# Patient Record
Sex: Female | Born: 1980
Health system: Southern US, Community
[De-identification: ages and names within clinical notes are randomized; demographics above are authoritative.]

## PROBLEM LIST (undated history)

## (undated) ENCOUNTER — Inpatient Hospital Stay (HOSPITAL_COMMUNITY): Payer: Self-pay

## (undated) DIAGNOSIS — G43909 Migraine, unspecified, not intractable, without status migrainosus: Secondary | ICD-10-CM

## (undated) DIAGNOSIS — F32A Depression, unspecified: Secondary | ICD-10-CM

## (undated) DIAGNOSIS — T7840XA Allergy, unspecified, initial encounter: Secondary | ICD-10-CM

## (undated) DIAGNOSIS — R87629 Unspecified abnormal cytological findings in specimens from vagina: Secondary | ICD-10-CM

## (undated) DIAGNOSIS — F419 Anxiety disorder, unspecified: Secondary | ICD-10-CM

## (undated) DIAGNOSIS — F329 Major depressive disorder, single episode, unspecified: Secondary | ICD-10-CM

## (undated) HISTORY — DX: Anxiety disorder, unspecified: F41.9

## (undated) HISTORY — PX: APPENDECTOMY: SHX54

## (undated) HISTORY — DX: Depression, unspecified: F32.A

## (undated) HISTORY — DX: Major depressive disorder, single episode, unspecified: F32.9

## (undated) HISTORY — DX: Migraine, unspecified, not intractable, without status migrainosus: G43.909

## (undated) HISTORY — DX: Unspecified abnormal cytological findings in specimens from vagina: R87.629

## (undated) HISTORY — DX: Allergy, unspecified, initial encounter: T78.40XA

---

## 2006-06-06 ENCOUNTER — Encounter: Admission: RE | Admit: 2006-06-06 | Discharge: 2006-06-06 | Payer: Self-pay | Admitting: Family Medicine

## 2006-06-23 ENCOUNTER — Emergency Department (HOSPITAL_COMMUNITY): Admission: EM | Admit: 2006-06-23 | Discharge: 2006-06-23 | Payer: Self-pay | Admitting: *Deleted

## 2006-10-31 ENCOUNTER — Ambulatory Visit: Payer: Self-pay | Admitting: Obstetrics and Gynecology

## 2006-10-31 ENCOUNTER — Encounter: Payer: Self-pay | Admitting: Obstetrics and Gynecology

## 2006-11-04 ENCOUNTER — Ambulatory Visit (HOSPITAL_COMMUNITY): Admission: RE | Admit: 2006-11-04 | Discharge: 2006-11-04 | Payer: Self-pay | Admitting: Obstetrics and Gynecology

## 2006-11-14 ENCOUNTER — Ambulatory Visit: Payer: Self-pay | Admitting: Family Medicine

## 2006-12-13 ENCOUNTER — Encounter: Admission: RE | Admit: 2006-12-13 | Discharge: 2006-12-13 | Payer: Self-pay | Admitting: Gastroenterology

## 2007-03-30 ENCOUNTER — Emergency Department (HOSPITAL_COMMUNITY): Admission: EM | Admit: 2007-03-30 | Discharge: 2007-03-30 | Payer: Self-pay | Admitting: Emergency Medicine

## 2007-04-25 ENCOUNTER — Ambulatory Visit (HOSPITAL_BASED_OUTPATIENT_CLINIC_OR_DEPARTMENT_OTHER): Admission: RE | Admit: 2007-04-25 | Discharge: 2007-04-25 | Payer: Self-pay | Admitting: Urology

## 2007-04-25 ENCOUNTER — Encounter (INDEPENDENT_AMBULATORY_CARE_PROVIDER_SITE_OTHER): Payer: Self-pay | Admitting: Urology

## 2008-12-23 ENCOUNTER — Emergency Department (HOSPITAL_COMMUNITY): Admission: EM | Admit: 2008-12-23 | Discharge: 2008-12-23 | Payer: Self-pay | Admitting: Emergency Medicine

## 2009-06-23 ENCOUNTER — Inpatient Hospital Stay (HOSPITAL_COMMUNITY): Admission: AD | Admit: 2009-06-23 | Discharge: 2009-06-23 | Payer: Self-pay | Admitting: Obstetrics and Gynecology

## 2009-07-04 ENCOUNTER — Inpatient Hospital Stay (HOSPITAL_COMMUNITY): Admission: AD | Admit: 2009-07-04 | Discharge: 2009-07-04 | Payer: Self-pay | Admitting: Obstetrics and Gynecology

## 2009-07-05 DIAGNOSIS — R002 Palpitations: Secondary | ICD-10-CM

## 2009-07-05 DIAGNOSIS — R0602 Shortness of breath: Secondary | ICD-10-CM

## 2009-07-18 ENCOUNTER — Ambulatory Visit: Payer: Self-pay | Admitting: Cardiology

## 2009-07-18 DIAGNOSIS — R42 Dizziness and giddiness: Secondary | ICD-10-CM

## 2009-07-22 ENCOUNTER — Ambulatory Visit: Payer: Self-pay

## 2009-07-22 ENCOUNTER — Ambulatory Visit (HOSPITAL_COMMUNITY): Admission: RE | Admit: 2009-07-22 | Discharge: 2009-07-22 | Payer: Self-pay | Admitting: Cardiology

## 2009-07-22 ENCOUNTER — Ambulatory Visit: Payer: Self-pay | Admitting: Cardiology

## 2009-07-22 ENCOUNTER — Encounter: Payer: Self-pay | Admitting: Cardiology

## 2009-07-26 LAB — CONVERTED CEMR LAB
BUN: 5 mg/dL — ABNORMAL LOW (ref 6–23)
CO2: 22 meq/L (ref 19–32)
Calcium: 9.3 mg/dL (ref 8.4–10.5)
Chloride: 108 meq/L (ref 96–112)
Creatinine, Ser: 0.7 mg/dL (ref 0.4–1.2)
GFR calc non Af Amer: 127.22 mL/min (ref >60)
Glucose, Bld: 89 mg/dL (ref 70–99)
Hemoglobin: 12.1 g/dL (ref 12.0–15.0)
Magnesium: 1.7 mg/dL (ref 1.5–2.5)
Potassium: 4 meq/L (ref 3.5–5.1)
Sodium: 137 meq/L (ref 135–145)
TSH: 0.86 microintl units/mL (ref 0.35–5.50)

## 2009-08-05 ENCOUNTER — Inpatient Hospital Stay (HOSPITAL_COMMUNITY): Admission: AD | Admit: 2009-08-05 | Discharge: 2009-08-07 | Payer: Self-pay | Admitting: Obstetrics and Gynecology

## 2009-12-06 ENCOUNTER — Emergency Department (HOSPITAL_COMMUNITY): Admission: EM | Admit: 2009-12-06 | Discharge: 2009-12-06 | Payer: Self-pay | Admitting: Emergency Medicine

## 2010-03-12 ENCOUNTER — Emergency Department (HOSPITAL_COMMUNITY): Admission: EM | Admit: 2010-03-12 | Discharge: 2010-03-12 | Payer: Self-pay | Admitting: Emergency Medicine

## 2010-11-20 LAB — CBC
HCT: 35.8 % — ABNORMAL LOW (ref 36.0–46.0)
Hemoglobin: 11.9 g/dL — ABNORMAL LOW (ref 12.0–15.0)
MCHC: 33.4 g/dL (ref 30.0–36.0)
MCHC: 33.8 g/dL (ref 30.0–36.0)
MCV: 90.8 fL (ref 78.0–100.0)
Platelets: 262 10*3/uL (ref 150–400)
Platelets: 322 10*3/uL (ref 150–400)
RBC: 3.36 MIL/uL — ABNORMAL LOW (ref 3.87–5.11)
RDW: 13.2 % (ref 11.5–15.5)
WBC: 14.7 10*3/uL — ABNORMAL HIGH (ref 4.0–10.5)

## 2010-11-22 LAB — KLEIHAUER-BETKE STAIN
Fetal Cells %: 0 %
Quantitation Fetal Hemoglobin: 0 mL

## 2010-12-26 ENCOUNTER — Emergency Department (HOSPITAL_COMMUNITY)
Admission: EM | Admit: 2010-12-26 | Discharge: 2010-12-26 | Disposition: A | Discharge status: Home / Self Care | Payer: Self-pay | Attending: Emergency Medicine | Admitting: Emergency Medicine

## 2010-12-26 DIAGNOSIS — G4700 Insomnia, unspecified: Secondary | ICD-10-CM | POA: Insufficient documentation

## 2010-12-26 DIAGNOSIS — N644 Mastodynia: Secondary | ICD-10-CM | POA: Insufficient documentation

## 2010-12-26 DIAGNOSIS — F3289 Other specified depressive episodes: Secondary | ICD-10-CM | POA: Insufficient documentation

## 2010-12-26 DIAGNOSIS — F329 Major depressive disorder, single episode, unspecified: Secondary | ICD-10-CM | POA: Insufficient documentation

## 2011-01-02 NOTE — Op Note (Signed)
NAME:  Heather Lin, Heather Lin            ACCOUNT NO.:  [id number]   MEDICAL RECORD NO.:  [id number]          PATIENT TYPE:  AMB   LOCATION:  [facility]                         FACILITY:  [facility]   PHYSICIAN:  Sigmund I. Patsi Sears, M.D.DATE OF BIRTH:  [birth year]   DATE OF PROCEDURE:  DATE OF DISCHARGE:                               OPERATIVE REPORT   PREOPERATIVE DIAGNOSIS:  IC.   POSTOPERATIVE DIAGNOSIS:  IC.   OPERATION:  Cystourethroscopy, hydrodistention of bladder, biopsy of the  bladder with cauterization of biopsy sites, installation of Pyridium and  Marcaine in the bladder, injection of Marcaine and Kenalog in the  subcarinal space.  B and O suppository and IV Toradol given as well.   PREPARTION:  After appropriate preanesthesia, the patient was brought to  the operating room, placed upon the operating table in the dorsal supine  position where general LMA anesthesia was introduced.  She was then  replaced in the dorsal lithotomy position where the pubis was prepped  with Betadine solution and draped in the usual fashion.   HISTORY:  This 30 year old single female, has a history of right lower  quadrant pain, as well as right upper quadrant pain.  She has had  negative GI and GYN evaluations.  She also has urinary frequency,  dysuria, nocturia, difficulties starting her stream, as well as  dyspareunia.   PROCEDURE:  Cystourethroscopy was accomplished, and shows a bladder  volume of 650 mL.  This was hydrodistended to a size 700 mL.  Biopsies  were taken of the bladder and the biopsy sites were cauterized.  Following this, Marcaine Pyridium is inserted in the bladder, and  Marcaine Kenalog is injected into the base of the bladder.  The patient  tolerated the procedure well.  B and O suppository was given at the  beginning of the procedure.  IV Toradol was given at the end of the  procedure.      Sigmund I. Patsi Sears, M.D.  Electronically Signed     SIT/MEDQ  D:  04/25/2007   T:  04/25/2007  Job:  [id number]

## 2011-01-05 NOTE — Group Therapy Note (Signed)
NAME:  Heather Lin, Heather Lin NO.:  [id number]   MEDICAL RECORD NO.:  [id number]          PATIENT TYPE:  WOC   LOCATION:  [facility]                   FACILITY:  [facility]   PHYSICIAN:  Tinnie Gens, MD        DATE OF BIRTH:  1981/06/16   DATE OF SERVICE:  11/14/2006                                  CLINIC NOTE   CHIEF COMPLAINT:  Follow up.   HISTORY OF PRESENT ILLNESS:  The patient is a 30 year old, G3, P2 who is  here today for follow-up.  She has previously been seen by my partner  who has evaluated her.  She has a history of an abnormal Pap smear.  She  had a followup Pap smear approximately 10 days ago that was normal.  She  has right lower quadrant pain that is well above the pelvis and is  actually like right middle quadrant pain.  The pain is constant when it  comes.  She is unsure about what brings it on.  She has some nausea and  vomiting, although it is not really associated with the pain.  The pain  is not exactly where you would expect it to be for gallstone disease.  The pain does not radiate to her back.  It does not radiate to her  shoulder.  She does report chronic constipation and constant straining  including some blood in her stools.  She notes no mucusy stools or foul-  smelling stools.  However, she has had chronic constipation for quite  awhile.  The patient had a history of an ovarian cyst.  She was seen in  the ER and was found to have a right hemorrhagic cyst.  She had a pelvic  sonogram done on November 04, 2006, that showed normal pelvic ultrasound  with complete resolution of a right ovarian cyst.   IMPRESSION:  Right middle quadrant pain not likely to be gynecologic in  nature given that her pain persists after the resolution of her ovarian  cyst and that it is really not in the area of the pelvis.  Suspect this  is more likely to be gastrointestinal.  There is really no muscular wall  abnormality or tenderness.   PLAN:  Will refer to GI.  Follow  up here as needed.  She did have a Pap  smear.  She will receive a letter as to followup date.           ______________________________  Tinnie Gens, MD     TP/MEDQ  D:  11/14/2006  T:  11/14/2006  Job:  [id number]

## 2011-01-05 NOTE — Group Therapy Note (Signed)
NAME:  Heather Lin, Heather Lin NO.:  [id number]   MEDICAL RECORD NO.:  [id number]          PATIENT TYPE:  WOC   LOCATION:  [facility]                   FACILITY:  [facility]   PHYSICIAN:  Argentina Donovan, MD        DATE OF BIRTH:  Feb 21, 1981   DATE OF SERVICE:  10/31/2006                                  CLINIC NOTE   The patient is a 30 year old, gravida 3, para 2-0-1-2, who recently  moved to [place].  She was on birth control pills and came off in  October of last year because she was having some cysts in her breasts,  and they did an ultrasound and told her may being caused by the pill.  She is now back on the pill and has been for the last month.  However,  about time of probably the first ovulation when she moved here she had  severe right lower quadrant pain, [facility]  got [facility] which showed a 4 cm complex cyst on the right ovary,  and was sent here for followup.  It has now been 2 months and we decided  we would get a followup ultrasound on her.   PHYSICAL EXAMINATION:  ABDOMEN:  Soft, flat, nontender, no masses or  organomegaly.  EXTERNAL GENITALIA:  Normal.  BUS within normal limits.  VAGINA:  Clean and well rugated.  CERVIX:  Clean and parous.  UTERUS:  Anterior and of normal size, shape and consistency.  ADNEXA:  Could not be well outlined.  The pain seems to be well above the pelvis.  Pap smear was done.   IMPRESSION:  Ovarian cyst with intermittent right lower quadrant  discomfort.  She has had a history of cervical dysplasia, had a  colposcopy last year and was told just to follow up with a Pap.           ______________________________  Argentina Donovan, MD     PR/MEDQ  D:  10/31/2006  T:  11/02/2006  Job:  [id number]

## 2011-06-04 LAB — CBC
HCT: 36.7
Hemoglobin: 12.6
RBC: 4.21
RDW: 13.2
WBC: 6.4

## 2011-06-04 LAB — DIFFERENTIAL
Basophils Absolute: 0
Lymphocytes Relative: 53 — ABNORMAL HIGH
Lymphs Abs: 3.4 — ABNORMAL HIGH
Monocytes Absolute: 0.4
Monocytes Relative: 6
Neutro Abs: 2.4

## 2011-06-04 LAB — WET PREP, GENITAL

## 2011-06-04 LAB — PREGNANCY, URINE: Preg Test, Ur: NEGATIVE

## 2011-06-04 LAB — URINALYSIS, ROUTINE W REFLEX MICROSCOPIC
Glucose, UA: NEGATIVE
Nitrite: NEGATIVE
Specific Gravity, Urine: 1.011
pH: 6.5

## 2011-06-04 LAB — GC/CHLAMYDIA PROBE AMP, GENITAL: GC Probe Amp, Genital: NEGATIVE

## 2011-06-04 LAB — RPR: RPR Ser Ql: NONREACTIVE

## 2012-01-09 ENCOUNTER — Ambulatory Visit: Payer: PRIVATE HEALTH INSURANCE

## 2012-01-09 ENCOUNTER — Ambulatory Visit: Payer: Self-pay

## 2012-01-09 ENCOUNTER — Ambulatory Visit (INDEPENDENT_AMBULATORY_CARE_PROVIDER_SITE_OTHER): Payer: PRIVATE HEALTH INSURANCE | Admitting: Family Medicine

## 2012-01-09 VITALS — BP 100/64 | HR 77 | Temp 98.0°F | Resp 16 | Ht 63.5 in | Wt 132.0 lb

## 2012-01-09 DIAGNOSIS — M542 Cervicalgia: Secondary | ICD-10-CM

## 2012-01-09 DIAGNOSIS — M545 Low back pain: Secondary | ICD-10-CM

## 2012-01-09 DIAGNOSIS — S39012A Strain of muscle, fascia and tendon of lower back, initial encounter: Secondary | ICD-10-CM

## 2012-01-09 DIAGNOSIS — M546 Pain in thoracic spine: Secondary | ICD-10-CM

## 2012-01-09 MED ORDER — KETOROLAC TROMETHAMINE 60 MG/2ML IM SOLN
60.0000 mg | Freq: Once | INTRAMUSCULAR | Status: AC
  Administered 2012-01-09: 60 mg via INTRAMUSCULAR

## 2012-01-10 ENCOUNTER — Telehealth: Payer: Self-pay

## 2012-01-10 DIAGNOSIS — S39012A Strain of muscle, fascia and tendon of lower back, initial encounter: Secondary | ICD-10-CM | POA: Insufficient documentation

## 2012-01-10 DIAGNOSIS — M542 Cervicalgia: Secondary | ICD-10-CM | POA: Insufficient documentation

## 2012-01-10 MED ORDER — DICLOFENAC SODIUM 75 MG PO TBEC: DELAYED_RELEASE_TABLET | ORAL | Status: DC

## 2012-01-10 MED ORDER — METAXALONE 800 MG PO TABS: ORAL_TABLET | ORAL | Status: DC

## 2012-01-10 NOTE — Assessment & Plan Note (Signed)
Predominantly muscle related on physical exam. Discussed NSAIDs and tylenol. Physical Therapy referral made.  No signs of radiculopathy though with some arthritic changes in C5-C7. If L arm persists, would consider low dose neurontin.

## 2012-01-10 NOTE — Telephone Encounter (Signed)
2 prescriptions sent to pharmacy.

## 2012-01-10 NOTE — Telephone Encounter (Signed)
Saw Dr. Alvester Morin wants someone to prescribe pain medications.  [phone]  Wants to talk with a doctor only.
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
## 2012-01-10 NOTE — Patient Instructions (Addendum)
Back Pain, Adult Back pain is very common. The pain often gets better over time. The cause of back pain is usually not dangerous. Most people can learn to manage their back pain on their own.  HOME CARE   Stay active. Start with short walks on flat ground if you can. Try to walk farther each day.   Do not sit, drive, or stand in one place for more than 30 minutes. Do not stay in bed.   Do not avoid exercise or work. Activity can help your back heal faster.   Be careful when you bend or lift an object. Bend at your knees, keep the object close to you, and do not twist.   Sleep on a firm mattress. Lie on your side, and bend your knees. If you lie on your back, put a pillow under your knees.   Only take medicines as told by your doctor.   Put ice on the injured area.   Put ice in a plastic bag.   Place a towel between your skin and the bag.   Leave the ice on for 15 to 20 minutes, 3 to 4 times a day for the first 2 to 3 days. After that, you can switch between ice and heat packs.   Ask your doctor about back exercises or massage.   Avoid feeling anxious or stressed. Find good ways to deal with stress, such as exercise.  GET HELP RIGHT AWAY IF:   Your pain does not go away with rest or medicine.   Your pain does not go away in 1 week.   You have new problems.   You do not feel well.   The pain spreads into your legs.   You cannot control when you poop (bowel movement) or pee (urinate).   Your arms or legs feel weak or lose feeling (numbness).   You feel sick to your stomach (nauseous) or throw up (vomit).   You have belly (abdominal) pain.   You feel like you may pass out (faint).  MAKE SURE YOU:   Understand these instructions.   Will watch your condition.   Will get help right away if you are not doing well or get worse.  Document Released: 01/23/2008 Document Revised: 07/26/2011 Document Reviewed: 12/25/2010 [facility] Patient Information 2012 [place],  [place].  Cervical Sprain A cervical sprain is when the ligaments in the neck stretch or tear. The ligaments are the tissues that hold the neck bones in place. HOME CARE   Put ice on the injured area.   Put ice in a plastic bag.   Place a towel between your skin and the bag.   Leave the ice on for 15 to 20 minutes, 3 to 4 times a day.   Only take medicine as told by your doctor.   Keep all doctor visits as told.   Keep all physical therapy visits as told.   If your doctor gives you a neck collar, wear it as told.   Do not drive while wearing a neck collar.   Adjust your work station so that you have good posture while you work.   Avoid positions and activities that make your problems worse.   Warm up and stretch before being active.  GET HELP RIGHT AWAY IF:   You are bleeding or your stomach is upset.   You have an allergic reaction to your medicine.   Your problems (symptoms) get worse.   You develop new problems.   You  lose feeling (numbness) or you cannot move (paralysis) any part of your body.   You have tingling or weakness in any part of your body.   Your pain is not controlled with medicine.   You cannot take less pain medicine over time as planned.   Your activity level does not improve as expected.  MAKE SURE YOU:   Understand these instructions.   Will watch your condition.   Will get help right away if you are not doing well or get worse.  Document Released: 01/23/2008 Document Revised: 07/26/2011 Document Reviewed: 05/10/2011 [facility] Patient Information 2012 [place], [place].

## 2012-01-10 NOTE — Assessment & Plan Note (Signed)
NSAIDs, tylenol. Physical Therapy. Handout given. Follow up as needed.

## 2012-01-10 NOTE — Telephone Encounter (Signed)
She saw the doctor last night and cannot remember who it was but she needs to speak to someone about the pain she is having and discuss pain medications. She is supposed to get a referral for the physical therapist and in the interim she is in pain and having to work.  Ibuprofin and any other otc meds do not help.

## 2012-01-11 ENCOUNTER — Other Ambulatory Visit: Payer: Self-pay

## 2012-01-11 MED ORDER — TRAMADOL HCL 50 MG PO TABS: 50.0000 mg | ORAL_TABLET | Freq: Three times a day (TID) | ORAL | Status: AC | PRN

## 2012-01-11 MED ORDER — ALPRAZOLAM 1 MG PO TABS: 0.5000 mg | ORAL_TABLET | Freq: Two times a day (BID) | ORAL | Status: AC | PRN

## 2012-01-11 NOTE — Telephone Encounter (Signed)
I sent in some Ultram.

## 2012-01-11 NOTE — Telephone Encounter (Signed)
Let pt know that her xanax has been sent in. Pt has ?s about whether she can use [place] as a PCP provider. Suggested she gets established w/ one of our providers as a PCP by getting a CPE and that she might want to set up appt at 104. Pt states that the muscle relaxant is not helping her pain and wanted to discuss whether we could Rx something else for it, or if she can get referral to pain management if that is more appropriate. Please advise.

## 2012-01-11 NOTE — Telephone Encounter (Signed)
Xanax Rx printed.

## 2012-01-11 NOTE — Telephone Encounter (Signed)
Left message on machine that rx was sent  

## 2012-01-29 ENCOUNTER — Ambulatory Visit: Payer: Self-pay | Admitting: Family Medicine

## 2012-02-03 ENCOUNTER — Ambulatory Visit: Payer: PRIVATE HEALTH INSURANCE | Admitting: Emergency Medicine

## 2012-02-03 VITALS — BP 103/63 | HR 73 | Temp 97.7°F | Resp 16 | Ht 62.5 in | Wt 133.6 lb

## 2012-02-03 MED ORDER — ESCITALOPRAM OXALATE 10 MG PO TABS: 10.0000 mg | ORAL_TABLET | Freq: Every day | ORAL | Status: DC

## 2012-02-03 NOTE — Progress Notes (Signed)
  Subjective:    Patient ID: Heather Lin, female    DOB: [birth year], 31 y.o.   MRN: [id number]  HPI patient and her feeling overwhelmed. Her hospitalized for diabetes and pancreatitis has been out of the hospital recently. 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 The overwhelmed with what is going on. She has been on SSRIs in the past. She has not been on any SSRIs recently. She does have an ongoing prescription for Xanax 1 mg takes half to one twice a day but has been taking that recently since her husband has been out of work.    Review of Systems     Objective:   Physical Exam  Constitutional:       Patient is alert cooperative but seems sad.  HENT:  Head: Normocephalic.  Eyes: Pupils are equal, round, and reactive to light.  Neck: Neck supple. No thyromegaly present.  Cardiovascular: Normal rate, regular rhythm and normal heart sounds.  Exam reveals no gallop and no friction rub.   No murmur heard. Pulmonary/Chest: Effort normal and breath sounds normal.          Assessment & Plan:  Patient here with worsening anxiety depression. She denies suicidal ideations. She is concerned that she will probably not be able to register for school because she does not have the money to do that. We'll start Lexapro one a day to 10 mg and see how she does over the next 3 weeks

## 2012-02-21 ENCOUNTER — Other Ambulatory Visit: Payer: Self-pay | Admitting: Physician Assistant

## 2012-02-23 ENCOUNTER — Other Ambulatory Visit: Payer: Self-pay | Admitting: Physician Assistant

## 2012-03-15 ENCOUNTER — Other Ambulatory Visit: Payer: Self-pay | Admitting: Physician Assistant

## 2012-03-17 ENCOUNTER — Telehealth: Payer: Self-pay

## 2012-03-17 NOTE — Telephone Encounter (Signed)
I called pt to advise Rx was sent to [facility] for her on 7/27

## 2012-03-17 NOTE — Telephone Encounter (Signed)
The patient called to request Xanax Rx be refilled.  The patient states she has been out of Xanax since 03/13/12 and would like the Rx called into [facility] on [address].  Please call the patient at [phone].

## 2012-04-04 ENCOUNTER — Other Ambulatory Visit: Payer: Self-pay | Admitting: Physician Assistant

## 2012-04-04 ENCOUNTER — Other Ambulatory Visit: Payer: Self-pay | Admitting: Family Medicine

## 2012-04-04 MED ORDER — ALPRAZOLAM 1 MG PO TABS: 1.0000 mg | ORAL_TABLET | Freq: Two times a day (BID) | ORAL | Status: DC | PRN

## 2012-04-04 NOTE — Telephone Encounter (Signed)
[phone] Pt would like to know if something could be prescribed cheaper for her lexipro she has no insurance and she took her last one and is worried about not having any meds.

## 2012-04-04 NOTE — Progress Notes (Signed)
Please call in alprazolam rx. Rx has been sent to printer multiple times without it successfully printing.

## 2012-04-04 NOTE — Progress Notes (Signed)
  Subjective:    Patient ID: Heather Lin, female    DOB: [birth year], 31 y.o.   MRN: [id number]  HPI    Review of Systems     Objective:   Physical Exam        Assessment & Plan:  Rx finally printed, will fax into pharmacy.

## 2012-04-04 NOTE — Addendum Note (Signed)
Addended by: Jacqualyn Posey on: 04/04/2012 03:01 PM   Modules accepted: Orders

## 2012-04-23 ENCOUNTER — Other Ambulatory Visit: Payer: Self-pay | Admitting: Physician Assistant

## 2012-05-13 ENCOUNTER — Other Ambulatory Visit: Payer: Self-pay | Admitting: Physician Assistant

## 2012-05-13 ENCOUNTER — Ambulatory Visit: Payer: BC Managed Care – PPO

## 2012-06-12 ENCOUNTER — Ambulatory Visit: Payer: BC Managed Care – PPO | Admitting: Emergency Medicine

## 2012-06-12 VITALS — BP 126/76 | HR 84 | Temp 98.3°F | Resp 17 | Ht 63.0 in | Wt 125.0 lb

## 2012-06-12 DIAGNOSIS — G43909 Migraine, unspecified, not intractable, without status migrainosus: Secondary | ICD-10-CM

## 2012-06-12 MED ORDER — ELETRIPTAN HYDROBROMIDE 40 MG PO TABS: 40.0000 mg | ORAL_TABLET | ORAL | Status: DC | PRN

## 2012-06-12 NOTE — Progress Notes (Signed)
[facility] [address], [place] [address] [phone]  Date:  06/12/2012   Name:  Heather Lin   DOB:  11-05-1980   MRN:  [id number]  PCP:  No primary provider on file.    Chief Complaint: Migraine and Nausea   History of Present Illness:  Heather Lin is a 31 y.o. very pleasant female patient who presents with the following:  Usually has only four migraines per year.  Has experienced two migraines this week.  Is concerned that she may need to change her BC due to increasing migraines.  No antecedent illness or injury.  Denies other complaint.  Not having a headache currently pain was present last night and was gone in morning.  Patient Active Problem List  Diagnosis  . DIZZINESS  . PALPITATIONS  . SHORTNESS OF BREATH  . Neck pain  . Lumbar strain  . Migraine    No past medical history on file.  No past surgical history on file.  History  Substance Use Topics  . Smoking status: Current Every Day Smoker -- 1.0 packs/day for .8 years    Types: Pipe  . Smokeless tobacco: Not on file  . Alcohol Use: Not on file    No family history on file.  No Known Allergies  Medication list has been reviewed and updated.  Current Outpatient Prescriptions on File Prior to Visit  Medication Sig Dispense Refill  . ALPRAZolam (XANAX) 1 MG tablet TAKE 1 TABLET BY MOUTH TWICE DAILY AS NEEDED  30 tablet  0  . Etonogestrel (IMPLANON Corley) Inject into the skin.      . Multiple Vitamin (MULTIVITAMIN) capsule Take 1 capsule by mouth daily.      . traZODone (DESYREL) 100 MG tablet Take 100 mg by mouth at bedtime.      Marland Kitchen DISCONTD: escitalopram (LEXAPRO) 10 MG tablet Take 1 tablet (10 mg total) by mouth daily.  30 tablet  11  . diclofenac (VOLTAREN) 75 MG EC tablet Take bid X 1 week then prn  60 tablet  0  . metaxalone (SKELAXIN) 800 MG tablet 1 tid X 1 week then prn  90 tablet  0    Review of Systems:  As per HPI, otherwise negative.    Physical  Examination: Filed Vitals:   06/12/12 1423  BP: 126/76  Pulse: 84  Temp: 98.3 F (36.8 C)  Resp: 17   Filed Vitals:   06/12/12 1423  Height: 5\' 3"  (1.6 m)  Weight: 125 lb (56.7 kg)   Body mass index is 22.14 kg/(m^2). Ideal Body Weight: Weight in (lb) to have BMI = 25: 140.8    GEN: WDWN, NAD, Non-toxic, Alert & Oriented x 3 HEENT: Atraumatic, Normocephalic.  Ears and Nose: No external deformity. EXTR: No clubbing/cyanosis/edema NEURO: Normal gait.  PSYCH: Normally interactive. Conversant. Not depressed or anxious appearing.  Calm demeanor.    Assessment and Plan: Migraine history relpax Follow up as needed  Carmelina Dane, MD  I have reviewed and agree with documentation. Robert P. Merla Riches, M.D.

## 2012-11-25 ENCOUNTER — Observation Stay (HOSPITAL_COMMUNITY)
Admission: EM | Admit: 2012-11-25 | Discharge: 2012-11-26 | Disposition: A | Discharge status: Home / Self Care | Payer: BC Managed Care – PPO | Attending: General Surgery | Admitting: General Surgery

## 2012-11-25 ENCOUNTER — Emergency Department (HOSPITAL_COMMUNITY): Payer: BC Managed Care – PPO

## 2012-11-25 ENCOUNTER — Encounter (HOSPITAL_COMMUNITY): Payer: Self-pay | Admitting: Emergency Medicine

## 2012-11-25 DIAGNOSIS — N3289 Other specified disorders of bladder: Secondary | ICD-10-CM | POA: Insufficient documentation

## 2012-11-25 DIAGNOSIS — K3580 Unspecified acute appendicitis: Secondary | ICD-10-CM

## 2012-11-25 DIAGNOSIS — R1031 Right lower quadrant pain: Secondary | ICD-10-CM | POA: Insufficient documentation

## 2012-11-25 DIAGNOSIS — R110 Nausea: Secondary | ICD-10-CM | POA: Insufficient documentation

## 2012-11-25 DIAGNOSIS — K37 Unspecified appendicitis: Secondary | ICD-10-CM

## 2012-11-25 LAB — CBC WITH DIFFERENTIAL/PLATELET
Basophils Relative: 0 % (ref 0–1)
Hemoglobin: 13.3 g/dL (ref 12.0–15.0)
Lymphocytes Relative: 27 % (ref 12–46)
MCHC: 35.8 g/dL (ref 30.0–36.0)
Monocytes Relative: 6 % (ref 3–12)
Neutro Abs: 8.1 10*3/uL — ABNORMAL HIGH (ref 1.7–7.7)
Neutrophils Relative %: 67 % (ref 43–77)
RBC: 4.31 MIL/uL (ref 3.87–5.11)
WBC: 12.2 10*3/uL — ABNORMAL HIGH (ref 4.0–10.5)

## 2012-11-25 LAB — BASIC METABOLIC PANEL
BUN: 15 mg/dL (ref 6–23)
Chloride: 101 mEq/L (ref 96–112)
GFR calc Af Amer: >90 mL/min (ref >90)
Potassium: 3.7 mEq/L (ref 3.5–5.1)

## 2012-11-25 LAB — URINALYSIS, ROUTINE W REFLEX MICROSCOPIC
Bilirubin Urine: NEGATIVE
Leukocytes, UA: NEGATIVE
Nitrite: NEGATIVE
Specific Gravity, Urine: 1.025 (ref 1.005–1.030)
pH: 8 (ref 5.0–8.0)

## 2012-11-25 LAB — URINE MICROSCOPIC-ADD ON

## 2012-11-25 MED ORDER — SODIUM CHLORIDE 0.9 % IV SOLN
1000.0000 mL | INTRAVENOUS | Status: DC
  Administered 2012-11-25 (×3): 1000 mL via INTRAVENOUS

## 2012-11-25 MED ORDER — DIPHENHYDRAMINE HCL 50 MG/ML IJ SOLN
25.0000 mg | Freq: Once | INTRAMUSCULAR | Status: AC
  Administered 2012-11-25: 25 mg via INTRAVENOUS

## 2012-11-25 MED ORDER — IOHEXOL 300 MG/ML  SOLN: 50.0000 mL | Freq: Once | INTRAMUSCULAR | Status: AC | PRN

## 2012-11-25 MED ORDER — SODIUM CHLORIDE 0.9 % IV SOLN
1000.0000 mL | Freq: Once | INTRAVENOUS | Status: AC
  Administered 2012-11-25: 1000 mL via INTRAVENOUS

## 2012-11-25 MED ORDER — HYDROMORPHONE HCL PF 1 MG/ML IJ SOLN
1.0000 mg | Freq: Once | INTRAMUSCULAR | Status: AC
  Administered 2012-11-25: 1 mg via INTRAVENOUS
  Filled 2012-11-25: qty 1

## 2012-11-25 MED ORDER — ONDANSETRON HCL 4 MG/2ML IJ SOLN
4.0000 mg | Freq: Once | INTRAMUSCULAR | Status: AC
  Administered 2012-11-25: 4 mg via INTRAVENOUS
  Filled 2012-11-25: qty 2

## 2012-11-25 MED ORDER — DIPHENHYDRAMINE HCL 50 MG/ML IJ SOLN
INTRAMUSCULAR | Status: AC
  Filled 2012-11-25: qty 1

## 2012-11-25 MED ORDER — IOHEXOL 300 MG/ML  SOLN
25.0000 mL | INTRAMUSCULAR | Status: AC
  Administered 2012-11-25 (×2): 25 mL via ORAL

## 2012-11-25 MED ORDER — SODIUM CHLORIDE 0.9 % IV SOLN
1.0000 g | Freq: Once | INTRAVENOUS | Status: AC
  Administered 2012-11-25: 1 g via INTRAVENOUS
  Filled 2012-11-25: qty 1

## 2012-11-25 MED ORDER — IOHEXOL 300 MG/ML  SOLN
100.0000 mL | Freq: Once | INTRAMUSCULAR | Status: AC | PRN
  Administered 2012-11-25: 100 mL via INTRAVENOUS

## 2012-11-25 NOTE — ED Notes (Signed)
Onset one day ago generalized abdominal pain with nausea. Last BM today normal. States pain worsening overtime. Currently 10/10 achy cramping sharp.

## 2012-11-25 NOTE — ED Notes (Signed)
Patient transported to CT 

## 2012-11-25 NOTE — H&P (Signed)
Heather Lin is an 32 y.o. female.   Chief Complaint: RLQ abdominal pain. HPI:  Pt is a 32 yo F who presents with 24 h of abdominal pain.  It started out as severe and generalized.  It has now localized to RLQ as the worst spot.  Originally, the patient thought she was developing an ovarian cyst, because she has gotten these frequently.  These are usually on the left, however, and her pain was worst on the right.  She has had nausea, but no vomiting.  She has felt bad all day.  She denies fevers/chills.  She denies constipation or diarrhea.    Past Medical History  Diagnosis Date  . Allergy   . Arthritis   . Depression   . Anxiety     History reviewed. No pertinent past surgical history.  Family History  Problem Relation Age of Onset  . Cancer Maternal Grandmother   . Anuerysm Maternal Grandfather   . Alzheimer's disease Paternal Grandmother    Social History:  reports that she has been smoking Pipe.  She does not have any smokeless tobacco history on file. She reports that she does not drink alcohol or use illicit drugs.  Allergies: No Known Allergies  Meds:  ibuprofen  Results for orders placed during the hospital encounter of 11/25/12 (from the past 48 hour(s))  CBC WITH DIFFERENTIAL     Status: Abnormal   Collection Time    11/25/12  6:49 PM      Result Value Range   WBC 12.2 (*) 4.0 - 10.5 K/uL   RBC 4.31  3.87 - 5.11 MIL/uL   Hemoglobin 13.3  12.0 - 15.0 g/dL   HCT 09.8  11.9 - 14.7 %   MCV 86.1  78.0 - 100.0 fL   MCH 30.9  26.0 - 34.0 pg   MCHC 35.8  30.0 - 36.0 g/dL   RDW 82.9  56.2 - 13.0 %   Platelets 314  150 - 400 K/uL   Neutrophils Relative 67  43 - 77 %   Neutro Abs 8.1 (*) 1.7 - 7.7 K/uL   Lymphocytes Relative 27  12 - 46 %   Lymphs Abs 3.3  0.7 - 4.0 K/uL   Monocytes Relative 6  3 - 12 %   Monocytes Absolute 0.7  0.1 - 1.0 K/uL   Eosinophils Relative 1  0 - 5 %   Eosinophils Absolute 0.1  0.0 - 0.7 K/uL   Basophils Relative 0  0 - 1 %   Basophils  Absolute 0.0  0.0 - 0.1 K/uL  BASIC METABOLIC PANEL     Status: None   Collection Time    11/25/12  6:49 PM      Result Value Range   Sodium 136  135 - 145 mEq/L   Potassium 3.7  3.5 - 5.1 mEq/L   Chloride 101  96 - 112 mEq/L   CO2 27  19 - 32 mEq/L   Glucose, Bld 94  70 - 99 mg/dL   BUN 15  6 - 23 mg/dL   Creatinine, Ser 8.65  0.50 - 1.10 mg/dL   Calcium 9.6  8.4 - 78.4 mg/dL   GFR calc non Af Amer >90  >90 mL/min   GFR calc Af Amer >90  >90 mL/min   Comment:            The eGFR has been calculated     using the CKD EPI equation.     This calculation  has not been     validated in all clinical     situations.     eGFR's persistently     <90 mL/min signify     possible Chronic Kidney Disease.  URINALYSIS, ROUTINE W REFLEX MICROSCOPIC     Status: Abnormal   Collection Time    11/25/12  7:04 PM      Result Value Range   Color, Urine YELLOW  YELLOW   APPearance TURBID (*) CLEAR   Specific Gravity, Urine 1.025  1.005 - 1.030   pH 8.0  5.0 - 8.0   Glucose, UA NEGATIVE  NEGATIVE mg/dL   Hgb urine dipstick MODERATE (*) NEGATIVE   Bilirubin Urine NEGATIVE  NEGATIVE   Ketones, ur NEGATIVE  NEGATIVE mg/dL   Protein, ur NEGATIVE  NEGATIVE mg/dL   Urobilinogen, UA 0.2  0.0 - 1.0 mg/dL   Nitrite NEGATIVE  NEGATIVE   Leukocytes, UA NEGATIVE  NEGATIVE  URINE MICROSCOPIC-ADD ON     Status: None   Collection Time    11/25/12  7:04 PM      Result Value Range   Squamous Epithelial / LPF RARE  RARE   WBC, UA 0-2  <3 WBC/hpf   RBC / HPF 0-2  <3 RBC/hpf   Bacteria, UA RARE  RARE   Urine-Other AMORPHOUS URATES/PHOSPHATES    POCT PREGNANCY, URINE     Status: None   Collection Time    11/25/12  7:13 PM      Result Value Range   Preg Test, Ur NEGATIVE  NEGATIVE   Comment:            THE SENSITIVITY OF THIS     METHODOLOGY IS >24 mIU/mL   Ct Abdomen Pelvis W Contrast  11/25/2012  *RADIOLOGY REPORT*  Clinical Data: Abdominal pain and nausea.  CT ABDOMEN AND PELVIS WITH CONTRAST   Technique:  Multidetector CT imaging of the abdomen and pelvis was performed following the standard protocol during bolus administration of intravenous contrast.  Contrast: OMNIPAQUE IOHEXOL 300 MG/ML  SOLN  Comparison: Prior pelvic ultrasound performed 03/30/2007  Findings: The visualized lung bases are clear.  The liver and spleen are unremarkable in appearance.  The gallbladder is within normal limits.  The pancreas and adrenal glands are unremarkable.  The kidneys are unremarkable in appearance.  There is no evidence of hydronephrosis.  No renal or ureteral stones are seen.  No perinephric stranding is appreciated.  No free fluid is identified.  The small bowel is unremarkable in appearance.  The stomach is within normal limits.  No acute vascular abnormalities are seen.  The appendix is difficult to fully characterize, though it appears to be diffusely dilated to 1.4 cm in maximal diameter, best seen on coronal images, with mild surrounding soft tissue edema.  This raises concern for acute appendicitis.  There is question of mildly prominent adjacent pericecal nodes; evaluation is suboptimal given the relative lack of surrounding fat.  The colon is unremarkable in appearance.  The bladder is moderately distended and grossly unremarkable.  The uterus is within normal limits.  The ovaries are difficult to fully assess.  There is a 5.8 x 4.7 cm cystic lesion at the right adnexa, along the pelvic cul-de-sac.  This may simply reflect an ovarian cyst, though pelvic ultrasound would be helpful for further evaluation, on an elective non-emergent basis.  No inguinal lymphadenopathy is seen.  No acute osseous abnormalities are identified.  IMPRESSION:  1.  Suspect acute appendicitis, with apparent  dilatation of the appendix to 1.4 cm in maximal diameter, and mild surrounding soft tissue edema.  This is difficult to fully characterize given the relative lack of surrounding fat.  Question of mildly prominent adjacent  pericecal nodes.  No evidence for perforation or abscess formation.  2.  5.8 x 4.7 cm cystic lesion at the right adnexa, along the pelvic cul-de-sac.  This may simply reflect an ovarian cyst, though pelvic ultrasound would be helpful for further evaluation, on an elective non-emergent basis.  These results were called by telephone on 11/25/2012 at 10:04 p.m. to Dr. Azalia Bilis, who verbally acknowledged these results.   Original Report Authenticated By: Tonia Ghent, M.D.     Review of Systems  Constitutional: Positive for malaise/fatigue.  HENT: Negative.   Eyes: Negative.   Respiratory: Negative.   Cardiovascular: Negative.   Gastrointestinal: Positive for nausea and abdominal pain.  Genitourinary: Negative.   Musculoskeletal: Negative.   Endo/Heme/Allergies:       Having period now.  Psychiatric/Behavioral: The patient is nervous/anxious.     Blood pressure 112/65, pulse 80, temperature 98.3 F (36.8 C), temperature source Oral, resp. rate 16, last menstrual period 11/25/2012, SpO2 100.00%. Physical Exam  Constitutional: She is oriented to person, place, and time. She appears well-developed and well-nourished. She appears distressed (looks uncomfortable).  HENT:  Head: Normocephalic and atraumatic.  Piercing in right nare and lower center lip  Eyes: Conjunctivae are normal. Pupils are equal, round, and reactive to light. No scleral icterus.  Neck: Normal range of motion. Neck supple. No tracheal deviation present. No thyromegaly present.  Cardiovascular: Regular rhythm, normal heart sounds and intact distal pulses.  Exam reveals no gallop and no friction rub.   No murmur heard. Tachycardic   Respiratory: Effort normal and breath sounds normal. No respiratory distress. She has no wheezes. She has no rales. She exhibits no tenderness.  GI: Soft. Bowel sounds are normal. She exhibits no mass. There is tenderness (RLQ, + rovsing's sign). There is rebound and guarding (voluntary RLQ  guarding.).  Musculoskeletal: Normal range of motion. She exhibits no edema and no tenderness.  Lymphadenopathy:    She has no cervical adenopathy.  Neurological: She is alert and oriented to person, place, and time. Coordination normal.  Skin: Skin is warm and dry. No rash noted. She is not diaphoretic. No erythema. No pallor.  Psychiatric: She has a normal mood and affect. Her behavior is normal. Judgment and thought content normal.     Assessment/Plan Acute appendicitis: Plan laparoscopic appendectomy  Appendectomy was described to the patient.  The incisions and surgical technique were explained.  The patient was advised of the risks of surgery including, but not limited to, bleeding, infection, damage to other structures, risk of an open operation, risk of abscess, and risk of blood clot.  The recovery was also described to the patient.  She was advised that he will have lifting restrictions for 2 weeks.   NPO until surgery, IVF, pain control, antiemetics IV antibiotics.  Alby Schwabe 11/25/2012, 11:22 PM

## 2012-11-25 NOTE — Preoperative (Signed)
Beta Blockers   Reason not to administer Beta Blockers:Not Applicable 

## 2012-11-25 NOTE — ED Provider Notes (Signed)
History     CSN: [id number]  Arrival date & time 11/25/12  1839   First MD Initiated Contact with Patient 11/25/12 1853      Chief Complaint  Patient presents with  . Abdominal Pain  . Nausea    The history is provided by the patient.   the patient reports developing generalized abdominal pain over the past 24-48 hours.  Her pain is worsened over the past 12 hours.  She's had nausea without vomiting.  She denies diarrhea.  No fevers or chills.  Her pain is localized to right lower quadrant.  Her pain is currently 10 out of 10.  Her pain is worsened by movement and palpation.  She's never had pain like this before.  No urinary symptoms.  No vaginal complaints.  Past Medical History  Diagnosis Date  . Allergy   . Arthritis   . Depression   . Anxiety     History reviewed. No pertinent past surgical history.  Family History  Problem Relation Age of Onset  . Cancer Maternal Grandmother   . Anuerysm Maternal Grandfather   . Alzheimer's disease Paternal Grandmother     History  Substance Use Topics  . Smoking status: Current Every Day Smoker -- 1.00 packs/day for .8 years    Types: Pipe  . Smokeless tobacco: Not on file  . Alcohol Use: No    OB History   Grav Para Term Preterm Abortions TAB SAB Ect Mult Living                  Review of Systems  Gastrointestinal: Positive for abdominal pain.  All other systems reviewed and are negative.    Allergies  Review of patient's allergies indicates no known allergies.  Home Medications   Current Outpatient Rx  Name  Route  Sig  Dispense  Refill  . ibuprofen (ADVIL,MOTRIN) 200 MG tablet   Oral   Take 400 mg by mouth every 6 (six) hours as needed for pain.           BP 112/65  Pulse 80  Temp(Src) 98.3 F (36.8 C) (Oral)  Resp 16  SpO2 100%  Physical Exam  Nursing note and vitals reviewed. Constitutional: She is oriented to person, place, and time. She appears well-developed and well-nourished. No distress.   HENT:  Head: Normocephalic and atraumatic.  Eyes: EOM are normal.  Neck: Normal range of motion.  Cardiovascular: Normal rate, regular rhythm and normal heart sounds.   Pulmonary/Chest: Effort normal and breath sounds normal.  Abdominal: Soft. She exhibits no distension.  RLQ tenderness without guarding  Musculoskeletal: Normal range of motion.  Neurological: She is alert and oriented to person, place, and time.  Skin: Skin is warm and dry.  Psychiatric: She has a normal mood and affect. Judgment normal.    ED Course  Procedures (including critical care time)  Labs Reviewed  CBC WITH DIFFERENTIAL - Abnormal; Notable for the following:    WBC 12.2 (*)    Neutro Abs 8.1 (*)    All other components within normal limits  URINALYSIS, ROUTINE W REFLEX MICROSCOPIC - Abnormal; Notable for the following:    APPearance TURBID (*)    Hgb urine dipstick MODERATE (*)    All other components within normal limits  BASIC METABOLIC PANEL  URINE MICROSCOPIC-ADD ON  POCT PREGNANCY, URINE   Ct Abdomen Pelvis W Contrast  11/25/2012  *RADIOLOGY REPORT*  Clinical Data: Abdominal pain and nausea.  CT ABDOMEN AND PELVIS WITH CONTRAST  Technique:  Multidetector CT imaging of the abdomen and pelvis was performed following the standard protocol during bolus administration of intravenous contrast.  Contrast: OMNIPAQUE IOHEXOL 300 MG/ML  SOLN  Comparison: Prior pelvic ultrasound performed 03/30/2007  Findings: The visualized lung bases are clear.  The liver and spleen are unremarkable in appearance.  The gallbladder is within normal limits.  The pancreas and adrenal glands are unremarkable.  The kidneys are unremarkable in appearance.  There is no evidence of hydronephrosis.  No renal or ureteral stones are seen.  No perinephric stranding is appreciated.  No free fluid is identified.  The small bowel is unremarkable in appearance.  The stomach is within normal limits.  No acute vascular abnormalities are  seen.  The appendix is difficult to fully characterize, though it appears to be diffusely dilated to 1.4 cm in maximal diameter, best seen on coronal images, with mild surrounding soft tissue edema.  This raises concern for acute appendicitis.  There is question of mildly prominent adjacent pericecal nodes; evaluation is suboptimal given the relative lack of surrounding fat.  The colon is unremarkable in appearance.  The bladder is moderately distended and grossly unremarkable.  The uterus is within normal limits.  The ovaries are difficult to fully assess.  There is a 5.8 x 4.7 cm cystic lesion at the right adnexa, along the pelvic cul-de-sac.  This may simply reflect an ovarian cyst, though pelvic ultrasound would be helpful for further evaluation, on an elective non-emergent basis.  No inguinal lymphadenopathy is seen.  No acute osseous abnormalities are identified.  IMPRESSION:  1.  Suspect acute appendicitis, with apparent dilatation of the appendix to 1.4 cm in maximal diameter, and mild surrounding soft tissue edema.  This is difficult to fully characterize given the relative lack of surrounding fat.  Question of mildly prominent adjacent pericecal nodes.  No evidence for perforation or abscess formation.  2.  5.8 x 4.7 cm cystic lesion at the right adnexa, along the pelvic cul-de-sac.  This may simply reflect an ovarian cyst, though pelvic ultrasound would be helpful for further evaluation, on an elective non-emergent basis.  These results were called by telephone on 11/25/2012 at 10:04 p.m. to Dr. Azalia Bilis, who verbally acknowledged these results.   Original Report Authenticated By: Tonia Ghent, M.D.    I personally reviewed the imaging tests through PACS system I reviewed available ER/hospitalization records through the EMR   1. Appendicitis       MDM  CT pending to evaluated cause of RLQ abdominal pain. No vaginal complaints. Pain to be treated once someone shows up to take care of her 3  children in the room (youngest is 6 years old)  10:12 PM CT demonstrates acute appendicitis.  General surgery we call.  Lyanne Co, MD 11/25/12 2212

## 2012-11-26 ENCOUNTER — Encounter (HOSPITAL_COMMUNITY)
Admission: EM | Disposition: A | Discharge status: Home / Self Care | Payer: Self-pay | Source: Home / Self Care | Attending: Emergency Medicine

## 2012-11-26 ENCOUNTER — Observation Stay (HOSPITAL_COMMUNITY): Payer: BC Managed Care – PPO | Admitting: Certified Registered Nurse Anesthetist

## 2012-11-26 ENCOUNTER — Encounter (HOSPITAL_COMMUNITY): Payer: Self-pay | Admitting: Certified Registered Nurse Anesthetist

## 2012-11-26 HISTORY — PX: LAPAROSCOPIC APPENDECTOMY: SHX408

## 2012-11-26 SURGERY — APPENDECTOMY, LAPAROSCOPIC
Anesthesia: General | Site: Abdomen | Wound class: Contaminated

## 2012-11-26 MED ORDER — HYDROCODONE-ACETAMINOPHEN 5-325 MG PO TABS: 1.0000 | ORAL_TABLET | ORAL | Status: DC | PRN

## 2012-11-26 MED ORDER — ONDANSETRON HCL 4 MG/2ML IJ SOLN
4.0000 mg | Freq: Four times a day (QID) | INTRAMUSCULAR | Status: DC | PRN
  Administered 2012-11-26: 4 mg via INTRAVENOUS

## 2012-11-26 MED ORDER — HYDROCODONE-ACETAMINOPHEN 5-325 MG PO TABS
1.0000 | ORAL_TABLET | ORAL | Status: DC | PRN
  Administered 2012-11-26: 2 via ORAL
  Filled 2012-11-26: qty 2

## 2012-11-26 MED ORDER — DIPHENHYDRAMINE HCL 12.5 MG/5ML PO ELIX: 12.5000 mg | ORAL_SOLUTION | Freq: Four times a day (QID) | ORAL | Status: DC | PRN

## 2012-11-26 MED ORDER — KETOROLAC TROMETHAMINE 15 MG/ML IJ SOLN
15.0000 mg | Freq: Four times a day (QID) | INTRAMUSCULAR | Status: DC
  Administered 2012-11-26 (×3): 15 mg via INTRAVENOUS
  Filled 2012-11-26 (×4): qty 1

## 2012-11-26 MED ORDER — KCL IN DEXTROSE-NACL 20-5-0.45 MEQ/L-%-% IV SOLN
INTRAVENOUS | Status: DC
  Administered 2012-11-26: 02:00:00 via INTRAVENOUS
  Filled 2012-11-26 (×3): qty 1000

## 2012-11-26 MED ORDER — ENOXAPARIN SODIUM 40 MG/0.4ML ~~LOC~~ SOLN: 40.0000 mg | SUBCUTANEOUS | Status: DC

## 2012-11-26 MED ORDER — SODIUM CHLORIDE 0.9 % IV SOLN
INTRAVENOUS | Status: DC | PRN
  Administered 2012-11-26: via INTRAVENOUS

## 2012-11-26 MED ORDER — LACTATED RINGERS IV SOLN
INTRAVENOUS | Status: DC | PRN
  Administered 2012-11-26: 01:00:00 via INTRAVENOUS

## 2012-11-26 MED ORDER — ACETAMINOPHEN 10 MG/ML IV SOLN
INTRAVENOUS | Status: DC | PRN
  Administered 2012-11-26: 1000 mg via INTRAVENOUS

## 2012-11-26 MED ORDER — FENTANYL CITRATE 0.05 MG/ML IJ SOLN
25.0000 ug | INTRAMUSCULAR | Status: DC | PRN
  Administered 2012-11-26: 25 ug via INTRAVENOUS
  Administered 2012-11-26: 50 ug via INTRAVENOUS
  Administered 2012-11-26: 25 ug via INTRAVENOUS

## 2012-11-26 MED ORDER — NICOTINE 14 MG/24HR TD PT24
14.0000 mg | MEDICATED_PATCH | Freq: Every day | TRANSDERMAL | Status: DC
  Filled 2012-11-26: qty 1

## 2012-11-26 MED ORDER — LIDOCAINE HCL 1 % IJ SOLN
INTRAMUSCULAR | Status: DC | PRN
  Administered 2012-11-26: 01:00:00 via INTRAMUSCULAR

## 2012-11-26 MED ORDER — ONDANSETRON HCL 4 MG/2ML IJ SOLN
INTRAMUSCULAR | Status: DC | PRN
  Administered 2012-11-26: 4 mg via INTRAVENOUS

## 2012-11-26 MED ORDER — IBUPROFEN 800 MG PO TABS: 400.0000 mg | ORAL_TABLET | Freq: Four times a day (QID) | ORAL | Status: DC | PRN

## 2012-11-26 MED ORDER — PROPOFOL 10 MG/ML IV BOLUS
INTRAVENOUS | Status: DC | PRN
  Administered 2012-11-26: 200 mg via INTRAVENOUS

## 2012-11-26 MED ORDER — DIPHENHYDRAMINE HCL 50 MG/ML IJ SOLN: 12.5000 mg | Freq: Four times a day (QID) | INTRAMUSCULAR | Status: DC | PRN

## 2012-11-26 MED ORDER — HYDROMORPHONE HCL PF 1 MG/ML IJ SOLN: 0.2500 mg | INTRAMUSCULAR | Status: DC | PRN

## 2012-11-26 MED ORDER — DOCUSATE SODIUM 100 MG PO CAPS
100.0000 mg | ORAL_CAPSULE | Freq: Two times a day (BID) | ORAL | Status: DC
  Administered 2012-11-26: 100 mg via ORAL
  Filled 2012-11-26: qty 1

## 2012-11-26 MED ORDER — ACETAMINOPHEN 650 MG RE SUPP: 650.0000 mg | Freq: Four times a day (QID) | RECTAL | Status: DC | PRN

## 2012-11-26 MED ORDER — ACETAMINOPHEN 325 MG PO TABS
650.0000 mg | ORAL_TABLET | Freq: Four times a day (QID) | ORAL | Status: DC | PRN
  Administered 2012-11-26: 650 mg via ORAL
  Filled 2012-11-26: qty 2

## 2012-11-26 MED ORDER — FENTANYL CITRATE 0.05 MG/ML IJ SOLN
INTRAMUSCULAR | Status: DC | PRN
  Administered 2012-11-26 (×4): 50 ug via INTRAVENOUS

## 2012-11-26 MED ORDER — SODIUM CHLORIDE 0.9 % IR SOLN
Status: DC | PRN
  Administered 2012-11-26: 1000 mL

## 2012-11-26 MED ORDER — FENTANYL CITRATE 0.05 MG/ML IJ SOLN
INTRAMUSCULAR | Status: AC
  Filled 2012-11-26: qty 2

## 2012-11-26 MED ORDER — SUCCINYLCHOLINE CHLORIDE 20 MG/ML IJ SOLN
INTRAMUSCULAR | Status: DC | PRN
  Administered 2012-11-26: 100 mg via INTRAVENOUS

## 2012-11-26 MED ORDER — ROCURONIUM BROMIDE 100 MG/10ML IV SOLN
INTRAVENOUS | Status: DC | PRN
  Administered 2012-11-26: 20 mg via INTRAVENOUS

## 2012-11-26 MED ORDER — GLYCOPYRROLATE 0.2 MG/ML IJ SOLN
INTRAMUSCULAR | Status: DC | PRN
  Administered 2012-11-26: .5 mg via INTRAVENOUS

## 2012-11-26 MED ORDER — LIDOCAINE HCL (CARDIAC) 20 MG/ML IV SOLN
INTRAVENOUS | Status: DC | PRN
  Administered 2012-11-26: 60 mg via INTRAVENOUS

## 2012-11-26 MED ORDER — KETOROLAC TROMETHAMINE 30 MG/ML IJ SOLN
INTRAMUSCULAR | Status: AC
  Filled 2012-11-26: qty 1

## 2012-11-26 MED ORDER — MIDAZOLAM HCL 5 MG/5ML IJ SOLN
INTRAMUSCULAR | Status: DC | PRN
  Administered 2012-11-26 (×2): 1 mg via INTRAVENOUS

## 2012-11-26 MED ORDER — KCL IN DEXTROSE-NACL 20-5-0.45 MEQ/L-%-% IV SOLN
INTRAVENOUS | Status: AC
  Filled 2012-11-26: qty 1000

## 2012-11-26 MED ORDER — NEOSTIGMINE METHYLSULFATE 1 MG/ML IJ SOLN
INTRAMUSCULAR | Status: DC | PRN
  Administered 2012-11-26: 2.5 mg via INTRAVENOUS

## 2012-11-26 MED ORDER — MORPHINE SULFATE 2 MG/ML IJ SOLN
1.0000 mg | INTRAMUSCULAR | Status: DC | PRN
  Administered 2012-11-26: 2 mg via INTRAVENOUS
  Filled 2012-11-26: qty 1

## 2012-11-26 MED ORDER — ONDANSETRON HCL 4 MG/2ML IJ SOLN
INTRAMUSCULAR | Status: AC
  Filled 2012-11-26: qty 2

## 2012-11-26 SURGICAL SUPPLY — 43 items
APPLIER CLIP ROT 10 11.4 M/L (STAPLE)
BLADE SURG ROTATE 9660 (MISCELLANEOUS) IMPLANT
CANISTER SUCTION 2500CC (MISCELLANEOUS) ×2 IMPLANT
CHLORAPREP W/TINT 26ML (MISCELLANEOUS) ×2 IMPLANT
CLIP APPLIE ROT 10 11.4 M/L (STAPLE) IMPLANT
CLOTH BEACON ORANGE TIMEOUT ST (SAFETY) ×2 IMPLANT
COVER SURGICAL LIGHT HANDLE (MISCELLANEOUS) ×2 IMPLANT
CUTTER FLEX LINEAR 45M (STAPLE) ×2 IMPLANT
DERMABOND ADVANCED (GAUZE/BANDAGES/DRESSINGS) ×1
DERMABOND ADVANCED .7 DNX12 (GAUZE/BANDAGES/DRESSINGS) ×1 IMPLANT
DRAPE UTILITY 15X26 W/TAPE STR (DRAPE) ×4 IMPLANT
DRAPE WARM FLUID 44X44 (DRAPE) ×2 IMPLANT
ELECT REM PT RETURN 9FT ADLT (ELECTROSURGICAL) ×2
ELECTRODE REM PT RTRN 9FT ADLT (ELECTROSURGICAL) ×1 IMPLANT
ENDOLOOP SUT PDS II  0 18 (SUTURE)
ENDOLOOP SUT PDS II 0 18 (SUTURE) IMPLANT
GLOVE BIO SURGEON STRL SZ 6 (GLOVE) ×4 IMPLANT
GLOVE BIO SURGEON STRL SZ 6.5 (GLOVE) ×2 IMPLANT
GLOVE BIOGEL PI IND STRL 6.5 (GLOVE) ×1 IMPLANT
GLOVE BIOGEL PI IND STRL 7.0 (GLOVE) ×1 IMPLANT
GLOVE BIOGEL PI INDICATOR 6.5 (GLOVE) ×1
GLOVE BIOGEL PI INDICATOR 7.0 (GLOVE) ×1
GLOVE SURG SS PI 7.0 STRL IVOR (GLOVE) ×2 IMPLANT
GOWN PREVENTION PLUS XXLARGE (GOWN DISPOSABLE) ×2 IMPLANT
GOWN STRL NON-REIN LRG LVL3 (GOWN DISPOSABLE) ×2 IMPLANT
KIT BASIN OR (CUSTOM PROCEDURE TRAY) ×2 IMPLANT
KIT ROOM TURNOVER OR (KITS) ×2 IMPLANT
NS IRRIG 1000ML POUR BTL (IV SOLUTION) ×2 IMPLANT
PAD ARMBOARD 7.5X6 YLW CONV (MISCELLANEOUS) ×4 IMPLANT
POUCH SPECIMEN RETRIEVAL 10MM (ENDOMECHANICALS) ×2 IMPLANT
RELOAD STAPLE TA45 3.5 REG BLU (ENDOMECHANICALS) ×2 IMPLANT
SCALPEL HARMONIC ACE (MISCELLANEOUS) ×2 IMPLANT
SET IRRIG TUBING LAPAROSCOPIC (IRRIGATION / IRRIGATOR) ×2 IMPLANT
SLEEVE ENDOPATH XCEL 5M (ENDOMECHANICALS) ×2 IMPLANT
SPECIMEN JAR SMALL (MISCELLANEOUS) ×2 IMPLANT
SUT MNCRL AB 4-0 PS2 18 (SUTURE) ×2 IMPLANT
TOWEL OR 17X24 6PK STRL BLUE (TOWEL DISPOSABLE) IMPLANT
TOWEL OR 17X26 10 PK STRL BLUE (TOWEL DISPOSABLE) ×2 IMPLANT
TRAY FOLEY CATH 14FR (SET/KITS/TRAYS/PACK) ×2 IMPLANT
TRAY LAPAROSCOPIC (CUSTOM PROCEDURE TRAY) ×2 IMPLANT
TROCAR XCEL BLUNT TIP 100MML (ENDOMECHANICALS) ×2 IMPLANT
TROCAR XCEL NON-BLD 5MMX100MML (ENDOMECHANICALS) ×2 IMPLANT
WATER STERILE IRR 1000ML POUR (IV SOLUTION) IMPLANT

## 2012-11-26 NOTE — Discharge Summary (Signed)
Physician Discharge Summary  Patient ID: MAPLE ODANIEL MRN: [id number] DOB/AGE: 33-Apr-1982 31 y.o.  Admit date: 11/25/2012 Discharge date: 11/26/2012  Admitting Diagnosis: RUQ abdominal pain Nausea Acute appendicitis  Discharge Diagnosis Patient Active Problem List   Diagnosis Date Noted  . Acute appendicitis, uncomplicated 11/25/2012  . Migraine 06/12/2012  . Neck pain 01/10/2012  . Lumbar strain 01/10/2012  . DIZZINESS 07/18/2009  . PALPITATIONS 07/05/2009  . SHORTNESS OF BREATH 07/05/2009    Consultants None  Imaging: Ct Abdomen Pelvis W Contrast  11/25/2012  *RADIOLOGY REPORT*  Clinical Data: Abdominal pain and nausea.  CT ABDOMEN AND PELVIS WITH CONTRAST  Technique:  Multidetector CT imaging of the abdomen and pelvis was performed following the standard protocol during bolus administration of intravenous contrast.  Contrast: OMNIPAQUE IOHEXOL 300 MG/ML  SOLN  Comparison: Prior pelvic ultrasound performed 03/30/2007  Findings: The visualized lung bases are clear.  The liver and spleen are unremarkable in appearance.  The gallbladder is within normal limits.  The pancreas and adrenal glands are unremarkable.  The kidneys are unremarkable in appearance.  There is no evidence of hydronephrosis.  No renal or ureteral stones are seen.  No perinephric stranding is appreciated.  No free fluid is identified.  The small bowel is unremarkable in appearance.  The stomach is within normal limits.  No acute vascular abnormalities are seen.  The appendix is difficult to fully characterize, though it appears to be diffusely dilated to 1.4 cm in maximal diameter, best seen on coronal images, with mild surrounding soft tissue edema.  This raises concern for acute appendicitis.  There is question of mildly prominent adjacent pericecal nodes; evaluation is suboptimal given the relative lack of surrounding fat.  The colon is unremarkable in appearance.  The bladder is moderately distended and  grossly unremarkable.  The uterus is within normal limits.  The ovaries are difficult to fully assess.  There is a 5.8 x 4.7 cm cystic lesion at the right adnexa, along the pelvic cul-de-sac.  This may simply reflect an ovarian cyst, though pelvic ultrasound would be helpful for further evaluation, on an elective non-emergent basis.  No inguinal lymphadenopathy is seen.  No acute osseous abnormalities are identified.  IMPRESSION:  1.  Suspect acute appendicitis, with apparent dilatation of the appendix to 1.4 cm in maximal diameter, and mild surrounding soft tissue edema.  This is difficult to fully characterize given the relative lack of surrounding fat.  Question of mildly prominent adjacent pericecal nodes.  No evidence for perforation or abscess formation.  2.  5.8 x 4.7 cm cystic lesion at the right adnexa, along the pelvic cul-de-sac.  This may simply reflect an ovarian cyst, though pelvic ultrasound would be helpful for further evaluation, on an elective non-emergent basis.  These results were called by telephone on 11/25/2012 at 10:04 p.m. to Dr. Azalia Bilis, who verbally acknowledged these results.   Original Report Authenticated By: Tonia Ghent, M.D.     Procedures Dr. Donell Beers (11/25/12) Laparoscopic Appendectomy  Hospital Course:  32 yo F who presents with 24 h of abdominal pain. It started out as severe and generalized. It has now localized to RLQ as the worst spot. Originally, the patient thought she was developing an ovarian cyst, because she has gotten these frequently. These are usually on the left, however, and her pain was worst on the right. She has had nausea, but no vomiting. She has felt bad all day. She denies fevers/chills. She denies constipation or diarrhea.  Workup showed acute appendicitis on CT scan and leukocytosis at 12.5.  Patient was admitted and underwent procedure listed above.  Tolerated procedure well and was transferred to the floor.  Diet was advanced as tolerated.   On POD #1, the patient was voiding well, tolerating diet, ambulating well, pain well controlled, vital signs stable, incisions c/d/i and felt stable for discharge home.  Patient will follow up in our office in 2 weeks and knows to call with questions or concerns.  Physical Exam: General:  Alert, NAD, pleasant, comfortable Abd:  Soft, ND, mild tenderness, incisions C/D/I    Medication List    STOP taking these medications       ibuprofen 200 MG tablet  Commonly known as:  ADVIL,MOTRIN      TAKE these medications       HYDROcodone-acetaminophen 5-325 MG per tablet  Commonly known as:  NORCO/VICODIN  Take 1-2 tablets by mouth every 4 (four) hours as needed.             Follow-up Information   Follow up with [facility] On 12/09/2012. (YOUR APPT IS AT 12:45PM, PLEASE ARRIVE AT 12:15 FOR CHECK IN)    Contact information:   [address]   [place] [address] [phone]       Signed: Candiss Norse [facility] [phone]  11/26/2012, 9:24 AM

## 2012-11-26 NOTE — Discharge Summary (Signed)
Patient, d/ced home, spouse picked pt up

## 2012-11-26 NOTE — Anesthesia Procedure Notes (Signed)
Procedure Name: Intubation Date/Time: 11/26/2012 12:36 AM Performed by: Julianne Rice Z Pre-anesthesia Checklist: Patient identified, Timeout performed, Emergency Drugs available, Suction available and Patient being monitored Patient Re-evaluated:Patient Re-evaluated prior to inductionOxygen Delivery Method: Circle system utilized Preoxygenation: Pre-oxygenation with 100% oxygen Intubation Type: IV induction, Rapid sequence and Cricoid Pressure applied Laryngoscope Size: Mac and 3 Grade View: Grade I Tube type: Oral Tube size: 7.5 mm Number of attempts: 1 Airway Equipment and Method: Stylet Placement Confirmation: ETT inserted through vocal cords under direct vision,  breath sounds checked- equal and bilateral and positive ETCO2 Secured at: 2 cm Tube secured with: Tape Dental Injury: Teeth and Oropharynx as per pre-operative assessment

## 2012-11-26 NOTE — Transfer of Care (Signed)
Immediate Anesthesia Transfer of Care Note  Patient: Heather Lin  Procedure(s) Performed: Procedure(s): APPENDECTOMY LAPAROSCOPIC (N/A)  Patient Location: PACU  Anesthesia Type:General  Level of Consciousness: awake and patient cooperative  Airway & Oxygen Therapy: Patient Spontanous Breathing and Patient connected to nasal cannula oxygen  Post-op Assessment: Report given to PACU RN and Post -op Vital signs reviewed and stable  Post vital signs: Reviewed and stable  Complications: No apparent anesthesia complications

## 2012-11-26 NOTE — Op Note (Signed)
Laparoscopic Appendectomy  Indications: The patient presented with a history of right-sided abdominal pain. A CT revealed findings consistent with acute appendicitis.  Pre-operative Diagnosis: Acute appendicitis without mention of peritonitis  Post-operative Diagnosis: Same  Surgeon: Almond Lint   Assistants: n/a   Anesthesia: General endotracheal anesthesia and Local anesthesia 1% buffered lidocaine, 0.25.% bupivacaine, with epinephrine  ASA Class: 2  Procedure Details  The patient was seen again in the Holding Room. The risks, benefits, complications, treatment options, and expected outcomes were discussed with the patient and/or family. The possibilities of perforation of viscus, bleeding, recurrent infection, the need for additional procedures, failure to diagnose a condition, and creating a complication requiring transfusion or operation were discussed. There was concurrence with the proposed plan and informed consent was obtained. The site of surgery was properly noted. The patient was taken to Operating Room, identified as Heather Lin and the procedure verified as Appendectomy. A Time Out was held and the above information confirmed.  The patient was placed in the supine position and general anesthesia was induced, along with placement of orogastric tube, Venodyne boots, and a Foley catheter. The abdomen was prepped and draped in a sterile fashion. Local anesthetic was infiltrated in the infraumbilical region.  A 1.5 cm curvilinear transverse incision was made just below the umbilicus.  The Kelly clamp was used to spread the subcutaneous tissues.  The fascia was elevated with 2 Kocher clamps and incised with the #11 blade.  A Tresa Endo was used to confirm entrance into the peritoneal cavity.  A pursestring suture was placed around the fascial incision.  The Hasson trocar was inserted into the abdomen and held in place with the tails of the suture.  The pneumoperitoneum was then  established to steady pressure of 15 mmHg.     Additional 5 mm cannulas then placed in the left lower quadrant of the abdomen and the suprapubic region under direct visualization.  A careful evaluation of the entire abdomen was carried out. The patient was placed in Trendelenburg and rotated to the left.  The small intestines were retracted in the cephalad and left lateral direction away from the pelvis and right lower quadrant. The patient was found to have an enlarged and inflamed appendix that was extending into the pelvis. There was no evidence of perforation.  The appendix was carefully dissected. The appendix was was skeletonized with the harmonic scalpel.   The appendix was divided at its base using an endo-GIA stapler. Minimal appendiceal stump was left in place. The appendix was removed from the abdomen with an Endocatch bag through the left subcostal port.  There was no evidence of bleeding, leakage, or complication after division of the appendix. Irrigation was also performed and irrigate suctioned from the abdomen as well.  The 5 mm trocars were removed.  The pneumoperitoneum was evacuated from the abdomen.    The trocar site skin wounds were closed with 4-0 Monocryl and dressed with Dermabond.  Instrument, sponge, and needle counts were correct at the conclusion of the case.   Findings: The appendix was found to be inflamed. There were not signs of necrosis.  There was not perforation. There was not abscess formation.  There was a Right sided ovarian cyst.  Pictures were taken.  Estimated Blood Loss:  Minimal         Drains: n/a          Specimens: appendix to pathology         Complications:  None; patient tolerated the  procedure well.         Disposition: PACU - hemodynamically stable.         Condition: stable

## 2012-11-26 NOTE — Anesthesia Preprocedure Evaluation (Addendum)
Anesthesia Evaluation  Patient identified by MRN, date of birth, ID band Patient awake    Reviewed: Allergy & Precautions, H&P , NPO status , Patient's Chart, lab work & pertinent test results  Airway Mallampati: I TM Distance: >3 FB Neck ROM: Full    Dental  (+) Teeth Intact and Dental Advisory Given   Pulmonary shortness of breath and with exertion, Current Smoker,          Cardiovascular  Echo 2010 during pregnancy normal as per patient.   Neuro/Psych  Headaches, Anxiety Depression  Neuromuscular disease    GI/Hepatic Neg liver ROS, Abdominal pain with n/v.   Endo/Other  negative endocrine ROS  Renal/GU negative Renal ROS     Musculoskeletal   Abdominal   Peds  Hematology   Anesthesia Other Findings   Reproductive/Obstetrics                        Anesthesia Physical Anesthesia Plan  ASA: II  Anesthesia Plan: General   Post-op Pain Management:    Induction: Intravenous  Airway Management Planned:   Additional Equipment:   Intra-op Plan:   Post-operative Plan: Extubation in OR  Informed Consent: I have reviewed the patients History and Physical, chart, labs and discussed the procedure including the risks, benefits and alternatives for the proposed anesthesia with the patient or authorized representative who has indicated his/her understanding and acceptance.   Dental advisory given  Plan Discussed with: CRNA, Anesthesiologist and Surgeon  Anesthesia Plan Comments:         Anesthesia Quick Evaluation

## 2012-11-26 NOTE — Anesthesia Postprocedure Evaluation (Signed)
  Anesthesia Post-op Note  Patient: Heather Lin  Procedure(s) Performed: Procedure(s): APPENDECTOMY LAPAROSCOPIC (N/A)  Patient Location: PACU  Anesthesia Type:General  Level of Consciousness: awake  Airway and Oxygen Therapy: Patient Spontanous Breathing  Post-op Pain: mild  Post-op Assessment: Post-op Vital signs reviewed  Post-op Vital Signs: Reviewed  Complications: No apparent anesthesia complications

## 2012-11-27 ENCOUNTER — Encounter (HOSPITAL_COMMUNITY): Payer: Self-pay | Admitting: General Surgery

## 2012-11-28 ENCOUNTER — Telehealth (INDEPENDENT_AMBULATORY_CARE_PROVIDER_SITE_OTHER): Payer: Self-pay | Admitting: *Deleted

## 2012-11-28 NOTE — Telephone Encounter (Signed)
Patient called to state that she still has not had a bowel movement.  Patient encourage to obtain some Milk of Mag to help get her bowels moving.  Patient agreeable at this time.  Patient also encouraged to drink plenty of water and eat a high fiber diet.

## 2012-12-03 ENCOUNTER — Telehealth (INDEPENDENT_AMBULATORY_CARE_PROVIDER_SITE_OTHER): Payer: Self-pay | Admitting: *Deleted

## 2012-12-03 NOTE — Telephone Encounter (Signed)
Patient called to ask for refill of pain medication.  Prescription for Norco 5/325mg  1 tablet every 6 hours as needed for pain, #30 no refills.  [facility] [phone]

## 2012-12-09 ENCOUNTER — Encounter (INDEPENDENT_AMBULATORY_CARE_PROVIDER_SITE_OTHER): Payer: BC Managed Care – PPO

## 2013-05-05 ENCOUNTER — Emergency Department (HOSPITAL_COMMUNITY)
Admission: EM | Admit: 2013-05-05 | Discharge: 2013-05-05 | Discharge status: Against Medical Advice | Payer: BC Managed Care – PPO | Attending: Emergency Medicine | Admitting: Emergency Medicine

## 2013-05-05 ENCOUNTER — Encounter (HOSPITAL_COMMUNITY): Payer: Self-pay | Admitting: Emergency Medicine

## 2013-05-05 DIAGNOSIS — F17200 Nicotine dependence, unspecified, uncomplicated: Secondary | ICD-10-CM | POA: Insufficient documentation

## 2013-05-05 DIAGNOSIS — M129 Arthropathy, unspecified: Secondary | ICD-10-CM | POA: Insufficient documentation

## 2013-05-05 DIAGNOSIS — F411 Generalized anxiety disorder: Secondary | ICD-10-CM | POA: Insufficient documentation

## 2013-05-05 DIAGNOSIS — R110 Nausea: Secondary | ICD-10-CM | POA: Insufficient documentation

## 2013-05-05 DIAGNOSIS — F3289 Other specified depressive episodes: Secondary | ICD-10-CM | POA: Insufficient documentation

## 2013-05-05 DIAGNOSIS — N949 Unspecified condition associated with female genital organs and menstrual cycle: Secondary | ICD-10-CM | POA: Insufficient documentation

## 2013-05-05 DIAGNOSIS — F329 Major depressive disorder, single episode, unspecified: Secondary | ICD-10-CM | POA: Insufficient documentation

## 2013-05-05 DIAGNOSIS — R109 Unspecified abdominal pain: Secondary | ICD-10-CM | POA: Insufficient documentation

## 2013-05-05 DIAGNOSIS — N938 Other specified abnormal uterine and vaginal bleeding: Secondary | ICD-10-CM | POA: Insufficient documentation

## 2013-05-05 DIAGNOSIS — Z9109 Other allergy status, other than to drugs and biological substances: Secondary | ICD-10-CM | POA: Insufficient documentation

## 2013-05-05 NOTE — ED Notes (Signed)
Unable to give urine specimen at triage at this time .  

## 2013-05-05 NOTE — ED Notes (Signed)
Pt. reports low abdominal cramping with occasional nausea and vaginal spotting for 1 month , denies emesis or diarrhea. No fever or chills.

## 2013-06-25 ENCOUNTER — Other Ambulatory Visit: Payer: Self-pay

## 2013-10-31 ENCOUNTER — Encounter (HOSPITAL_COMMUNITY): Payer: Self-pay | Admitting: Emergency Medicine

## 2013-10-31 DIAGNOSIS — S0993XA Unspecified injury of face, initial encounter: Secondary | ICD-10-CM | POA: Insufficient documentation

## 2013-10-31 DIAGNOSIS — S199XXA Unspecified injury of neck, initial encounter: Secondary | ICD-10-CM

## 2013-10-31 DIAGNOSIS — IMO0002 Reserved for concepts with insufficient information to code with codable children: Secondary | ICD-10-CM | POA: Insufficient documentation

## 2013-10-31 DIAGNOSIS — Y9389 Activity, other specified: Secondary | ICD-10-CM | POA: Insufficient documentation

## 2013-10-31 DIAGNOSIS — Y92410 Unspecified street and highway as the place of occurrence of the external cause: Secondary | ICD-10-CM | POA: Insufficient documentation

## 2013-10-31 DIAGNOSIS — F17200 Nicotine dependence, unspecified, uncomplicated: Secondary | ICD-10-CM | POA: Insufficient documentation

## 2013-10-31 DIAGNOSIS — Z8739 Personal history of other diseases of the musculoskeletal system and connective tissue: Secondary | ICD-10-CM | POA: Insufficient documentation

## 2013-10-31 DIAGNOSIS — Z8659 Personal history of other mental and behavioral disorders: Secondary | ICD-10-CM | POA: Insufficient documentation

## 2013-10-31 DIAGNOSIS — S0990XA Unspecified injury of head, initial encounter: Secondary | ICD-10-CM | POA: Insufficient documentation
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
## 2013-11-01 ENCOUNTER — Emergency Department (HOSPITAL_COMMUNITY)
Admission: EM | Admit: 2013-11-01 | Discharge: 2013-11-01 | Disposition: A | Discharge status: Home / Self Care | Payer: BC Managed Care – PPO | Attending: Emergency Medicine | Admitting: Emergency Medicine

## 2013-11-01 DIAGNOSIS — M542 Cervicalgia: Secondary | ICD-10-CM

## 2013-11-01 DIAGNOSIS — M549 Dorsalgia, unspecified: Secondary | ICD-10-CM

## 2013-11-01 MED ORDER — MELOXICAM 7.5 MG PO TABS: 15.0000 mg | ORAL_TABLET | Freq: Every day | ORAL | Status: DC

## 2013-11-01 MED ORDER — METHOCARBAMOL 500 MG PO TABS
500.0000 mg | ORAL_TABLET | Freq: Once | ORAL | Status: AC
  Administered 2013-11-01: 500 mg via ORAL
  Filled 2013-11-01: qty 1

## 2013-11-01 MED ORDER — METHOCARBAMOL 500 MG PO TABS: 500.0000 mg | ORAL_TABLET | Freq: Two times a day (BID) | ORAL | Status: DC

## 2013-11-01 MED ORDER — IBUPROFEN 800 MG PO TABS
800.0000 mg | ORAL_TABLET | Freq: Once | ORAL | Status: AC
  Administered 2013-11-01: 800 mg via ORAL
  Filled 2013-11-01: qty 1
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
## 2013-11-01 NOTE — ED Notes (Signed)
Pt discharged.Vital signs stable and GCS 15 
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
## 2013-11-02 NOTE — ED Provider Notes (Signed)
Medical screening examination/treatment/procedure(s) were performed by non-physician practitioner and as supervising physician I was immediately available for consultation/collaboration.   EKG Interpretation None       Derwood KaplanAnkit Laurens Matheny, MD 11/02/13 [phone]

## 2014-05-17 ENCOUNTER — Encounter (HOSPITAL_COMMUNITY): Payer: Self-pay | Admitting: Emergency Medicine

## 2014-05-17 ENCOUNTER — Emergency Department (HOSPITAL_COMMUNITY): Payer: BC Managed Care – PPO

## 2014-05-17 ENCOUNTER — Emergency Department (HOSPITAL_COMMUNITY)
Admission: EM | Admit: 2014-05-17 | Discharge: 2014-05-17 | Disposition: A | Discharge status: Home / Self Care | Payer: BC Managed Care – PPO | Attending: Emergency Medicine | Admitting: Emergency Medicine

## 2014-05-17 DIAGNOSIS — O99330 Smoking (tobacco) complicating pregnancy, unspecified trimester: Secondary | ICD-10-CM | POA: Diagnosis not present

## 2014-05-17 DIAGNOSIS — O9989 Other specified diseases and conditions complicating pregnancy, childbirth and the puerperium: Secondary | ICD-10-CM | POA: Insufficient documentation

## 2014-05-17 DIAGNOSIS — Z79899 Other long term (current) drug therapy: Secondary | ICD-10-CM | POA: Diagnosis not present

## 2014-05-17 DIAGNOSIS — O039 Complete or unspecified spontaneous abortion without complication: Secondary | ICD-10-CM | POA: Insufficient documentation

## 2014-05-17 DIAGNOSIS — O209 Hemorrhage in early pregnancy, unspecified: Secondary | ICD-10-CM | POA: Diagnosis present

## 2014-05-17 DIAGNOSIS — Z8659 Personal history of other mental and behavioral disorders: Secondary | ICD-10-CM | POA: Diagnosis not present

## 2014-05-17 DIAGNOSIS — M129 Arthropathy, unspecified: Secondary | ICD-10-CM | POA: Insufficient documentation

## 2014-05-17 LAB — WET PREP, GENITAL
CLUE CELLS WET PREP: NONE SEEN
TRICH WET PREP: NONE SEEN
Yeast Wet Prep HPF POC: NONE SEEN

## 2014-05-17 LAB — CBC
HEMATOCRIT: 37.8 % (ref 36.0–46.0)
HEMOGLOBIN: 12.7 g/dL (ref 12.0–15.0)
MCH: 29.8 pg (ref 26.0–34.0)
MCHC: 33.6 g/dL (ref 30.0–36.0)
MCV: 88.7 fL (ref 78.0–100.0)
Platelets: 296 10*3/uL (ref 150–400)
RBC: 4.26 MIL/uL (ref 3.87–5.11)
RDW: 12.3 % (ref 11.5–15.5)
WBC: 8.6 10*3/uL (ref 4.0–10.5)

## 2014-05-17 LAB — ABO/RH: ABO/RH(D): O POS

## 2014-05-17 LAB — HCG, QUANTITATIVE, PREGNANCY: hCG, Beta Chain, Quant, S: 1597 m[IU]/mL — ABNORMAL HIGH (ref <5)

## 2014-05-17 MED ORDER — ACETAMINOPHEN 325 MG PO TABS
650.0000 mg | ORAL_TABLET | Freq: Once | ORAL | Status: AC
  Administered 2014-05-17: 650 mg via ORAL
  Filled 2014-05-17: qty 2

## 2014-05-17 NOTE — ED Notes (Signed)
Pt to ultrasound on stretcher

## 2014-05-17 NOTE — Discharge Instructions (Signed)
Miscarriage A miscarriage is the sudden loss of an unborn baby (fetus) before the 20th week of pregnancy. Most miscarriages happen in the first 3 months of pregnancy. Sometimes, it happens before a woman even knows she is pregnant. A miscarriage is also called a "spontaneous miscarriage" or "early pregnancy loss." Having a miscarriage can be an emotional experience. Talk with your caregiver about any questions you may have about miscarrying, the grieving process, and your future pregnancy plans. CAUSES   Problems with the fetal chromosomes that make it impossible for the baby to develop normally. Problems with the baby's genes or chromosomes are most often the result of errors that occur, by chance, as the embryo divides and grows. The problems are not inherited from the parents.  Infection of the cervix or uterus.   Hormone problems.   Problems with the cervix, such as having an incompetent cervix. This is when the tissue in the cervix is not strong enough to hold the pregnancy.   Problems with the uterus, such as an abnormally shaped uterus, uterine fibroids, or congenital abnormalities.   Certain medical conditions.   Smoking, drinking alcohol, or taking illegal drugs.   Trauma.  Often, the cause of a miscarriage is unknown.  SYMPTOMS   Vaginal bleeding or spotting, with or without cramps or pain.  Pain or cramping in the abdomen or lower back.  Passing fluid, tissue, or blood clots from the vagina. DIAGNOSIS  Your caregiver will perform a physical exam. You may also have an ultrasound to confirm the miscarriage. Blood or urine tests may also be ordered. TREATMENT   Sometimes, treatment is not necessary if you naturally pass all the fetal tissue that was in the uterus. If some of the fetus or placenta remains in the body (incomplete miscarriage), tissue left behind may become infected and must be removed. Usually, a dilation and curettage (D and C) procedure is performed.  During a D and C procedure, the cervix is widened (dilated) and any remaining fetal or placental tissue is gently removed from the uterus.  Antibiotic medicines are prescribed if there is an infection. Other medicines may be given to reduce the size of the uterus (contract) if there is a lot of bleeding.  If you have Rh negative blood and your baby was Rh positive, you will need a Rh immunoglobulin shot. This shot will protect any future baby from having Rh blood problems in future pregnancies. HOME CARE INSTRUCTIONS   Your caregiver may order bed rest or may allow you to continue light activity. Resume activity as directed by your caregiver.  Have someone help with home and family responsibilities during this time.   Keep track of the number of sanitary pads you use each day and how soaked (saturated) they are. Write down this information.   Do not use tampons. Do not douche or have sexual intercourse until approved by your caregiver.   Only take over-the-counter or prescription medicines for pain or discomfort as directed by your caregiver.   Do not take aspirin. Aspirin can cause bleeding.   Keep all follow-up appointments with your caregiver.   If you or your partner have problems with grieving, talk to your caregiver or seek counseling to help cope with the pregnancy loss. Allow enough time to grieve before trying to get pregnant again.  SEEK IMMEDIATE MEDICAL CARE IF:   You have severe cramps or pain in your back or abdomen.  You have a fever.  You pass large blood clots (walnut-sized   or larger) ortissue from your vagina. Save any tissue for your caregiver to inspect.   Your bleeding increases.   You have a thick, bad-smelling vaginal discharge.  You become lightheaded, weak, or you faint.   You have chills.  MAKE SURE YOU:  Understand these instructions.  Will watch your condition.  Will get help right away if you are not doing well or get  worse. Document Released: 01/30/2001 Document Revised: 12/01/2012 Document Reviewed: 09/25/2011 [employer] Patient Information 2015 [employer]. This information is not intended to replace advice given to you by your health care provider. Make sure you discuss any questions you have with your health care provider.  

## 2014-05-17 NOTE — ED Notes (Signed)
Per pt sts vaginal bleeding x 2 weeks and now abdominal cramping and bleeding. sts [redacted] weeks pregnant. sts confirmed with [place].

## 2014-05-17 NOTE — ED Provider Notes (Signed)
Medical screening examination/treatment/procedure(s) were performed by non-physician practitioner and as supervising physician I was immediately available for consultation/collaboration.   EKG Interpretation None        Margues Filippini, MD 05/17/14 1623 

## 2014-05-17 NOTE — ED Notes (Addendum)
Had an ultrasound recently, had a "pap smear -- swabs -- last Monday" -- spoke to the nurse at [facility] call-- told to stay home and rest. Called the dr's office this am, was told to come to the ED. States has been bleeding for a week-- passing clots at present-- [redacted] weeks pregnant.

## 2014-05-17 NOTE — ED Notes (Signed)
Pt returned from ultrasound

## 2014-05-17 NOTE — ED Provider Notes (Signed)
CSN: [id number]     Arrival date & time 05/17/14  1058 History   First MD Initiated Contact with Patient 05/17/14 1215     Chief Complaint  Patient presents with  . Abdominal Pain  . Vaginal Bleeding     (Consider location/radiation/quality/duration/timing/severity/associated sxs/prior Treatment) HPI Heather Lin is a 33 y.o. female is G5 P3 with one miscarriage comes in today for abdominal pain and vaginal bleeding. Patient states last week on Monday she went to her OB for a cervical exam, after the exam she began bleeding more heavily-she equates it to a heavy menstrual flow. She reports clots and bright red blood. She reports crampy abdominal pain. She reports being at [redacted] weeks gestation confirmed by ultrasound at her OBs office. She only takes prenatal vitamins, no other medications, denies cocaine use.  Past Medical History  Diagnosis Date  . Allergy   . Arthritis   . Depression   . Anxiety    Past Surgical History  Procedure Laterality Date  . Laparoscopic appendectomy N/A 11/26/2012    Procedure: APPENDECTOMY LAPAROSCOPIC;  Surgeon: Almond Lint, MD;  Location: [facility];  Service: General;  Laterality: N/A;  . Appendectomy     Family History  Problem Relation Age of Onset  . Cancer Maternal Grandmother   . Anuerysm Maternal Grandfather   . Alzheimer's disease Paternal Grandmother    History  Substance Use Topics  . Smoking status: Current Every Day Smoker -- 1.00 packs/day for .8 years    Types: Pipe  . Smokeless tobacco: Not on file  . Alcohol Use: No   OB History   Grav Para Term Preterm Abortions TAB SAB Ect Mult Living                 Review of Systems  Constitutional: Negative for fever.  HENT: Negative for sore throat.   Eyes: Negative for visual disturbance.  Respiratory: Negative for shortness of breath.   Cardiovascular: Negative for chest pain.  Gastrointestinal:       Abdominal cramping  Endocrine: Negative for polyuria.  Genitourinary: Positive  for vaginal bleeding. Negative for dysuria.       Increased vaginal bleeding  Skin: Negative for rash.  Neurological: Negative for headaches.      Allergies  Dilaudid  Home Medications   Prior to Admission medications   Medication Sig Start Date End Date Taking? Authorizing Provider  acetaminophen (TYLENOL) 500 MG tablet Take 1,000 mg by mouth every 6 (six) hours as needed for mild pain or moderate pain.   Yes Historical Provider, MD  Docosahexaenoic Acid (DHA PO) Take 1 capsule by mouth daily.   Yes Historical Provider, MD  Prenatal Vit-Fe Fumarate-FA (PRENATAL MULTIVITAMIN) TABS tablet Take 1 tablet by mouth daily at 12 noon.   Yes Historical Provider, MD   BP 109/63  Pulse 88  Temp(Src) 97.8 F (36.6 C) (Oral)  Resp 16  Ht  (1.6 m)  Wt 174 lb 2 oz (78.983 kg)  BMI 30.85 kg/m2  SpO2 98%  LMP 03/09/2014 Physical Exam  Nursing note and vitals reviewed. Constitutional: She is oriented to person, place, and time. She appears well-developed and well-nourished.  HENT:  Head: Normocephalic and atraumatic.  Mouth/Throat: Oropharynx is clear and moist.  Eyes: Conjunctivae are normal. Pupils are equal, round, and reactive to light. Right eye exhibits no discharge. Left eye exhibits no discharge. No scleral icterus.  Neck: Neck supple.  Cardiovascular: Normal rate, regular rhythm and normal heart sounds.   Pulmonary/Chest:  Effort normal and breath sounds normal. No respiratory distress. She has no wheezes. She has no rales.  Abdominal: Soft. There is no tenderness.  Genitourinary:  Chaperone was present for the entire genital exam. No lesions or rashes appreciated on vulva. Cervix visualized on speculum exam and appropriate cultures sampled. Os visualized and appears closed. Copius blood in vaginal vault. Discharge not appreciated due to blood Upon bi manual exam- No TTP of the adnexa, mild cervical tenderness. No fullness or masses appreciated. No abnormalities appreciated  in structural anatomy.   Musculoskeletal: She exhibits no tenderness.  Neurological: She is alert and oriented to person, place, and time.  Cranial Nerves II-XII grossly intact  Skin: Skin is warm and dry. No rash noted.  Psychiatric: She has a normal mood and affect.    ED Course  Procedures (including critical care time) Labs Review Labs Reviewed  WET PREP, GENITAL - Abnormal; Notable for the following:    WBC, Wet Prep HPF POC FEW (*)    All other components within normal limits  HCG, QUANTITATIVE, PREGNANCY - Abnormal; Notable for the following:    hCG, Beta Chain, Quant, S 1597 (*)    All other components within normal limits  GC/CHLAMYDIA PROBE AMP  CBC  ABO/RH    Imaging Review US Ob Comp Less 14 Wks  05/17/2014   CLINICAL DATA:  Quantitative beta HCG is 1,597. Bleeding. By LMP 03/09/2014 the patient is 9 weeks 6 days. EDC by LMP is 12/14/2014.  EXAM: OBSTETRIC <14 WK [place] AND TRANSVAGINAL OB US  TECHNIQUE: Both transabdominal and transvaginal ultrasound examinations were performed for complete evaluation of the gestation as well as the maternal uterus, adnexal regions, and pelvic cul-de-sac. Transvaginal technique was performed to assess early pregnancy.  COMPARISON:  None applicable  FINDINGS: Intrauterine gestational sac: Visualized/normal in shape.  Yolk sac:  Not seen  Embryo:  Present  Cardiac Activity: Not seen  CRL:   17.8  mm   8 w 2 d  Maternal uterus/adnexae: No subchorionic hemorrhage identified. Right ovarian corpus luteum cyst is 1.6 cm. The left ovary has a normal appearance. Trace free pelvic fluid.  IMPRESSION: Intrauterine fetal demise measuring 8 weeks 2 days.   Electronically Signed   By: Rosalie Gums M.D.   On: 05/17/2014 15:45   US Ob Transvaginal  05/17/2014   CLINICAL DATA:  Quantitative beta HCG is 1,597. Bleeding. By LMP 03/09/2014 the patient is 9 weeks 6 days. EDC by LMP is 12/14/2014.  EXAM: OBSTETRIC <14 WK [place] AND TRANSVAGINAL OB US  TECHNIQUE: Both  transabdominal and transvaginal ultrasound examinations were performed for complete evaluation of the gestation as well as the maternal uterus, adnexal regions, and pelvic cul-de-sac. Transvaginal technique was performed to assess early pregnancy.  COMPARISON:  None applicable  FINDINGS: Intrauterine gestational sac: Visualized/normal in shape.  Yolk sac:  Not seen  Embryo:  Present  Cardiac Activity: Not seen  CRL:   17.8  mm   8 w 2 d  Maternal uterus/adnexae: No subchorionic hemorrhage identified. Right ovarian corpus luteum cyst is 1.6 cm. The left ovary has a normal appearance. Trace free pelvic fluid.  IMPRESSION: Intrauterine fetal demise measuring 8 weeks 2 days.   Electronically Signed   By: Rosalie Gums M.D.   On: 05/17/2014 15:45     EKG Interpretation None     Meds given in ED:  Medications  acetaminophen (TYLENOL) tablet 650 mg (650 mg Oral Given 05/17/14 1256)    New Prescriptions  No medications on file   Filed Vitals:   05/17/14 1122  BP: 109/63  Pulse: 88  Temp: 97.8 F (36.6 C)  TempSrc: Oral  Resp: 16  Height:  (1.6 m)  Weight: 174 lb 2 oz (78.983 kg)  SpO2: 98%    MDM  Vitals stable - WNL -afebrile Pt resting comfortably in ED. PE and clinical picture consistent with miscarriage Labwork concerning for low b-hcg with 9 week pregnancy. Obtained OB US for further evaluation. US shows intrauterine fetal demise. Discussed results of ultrasound with patient. Patient tolerated news well. I was available to answer any questions or concerns  Discussed f/u with PCP and OB within 2 days for further evaluation and follow up care. Discussed return precautions, pt very amenable to plan.  Prior to patient discharge, I discussed and reviewed this case with Dr.Walden    Final diagnoses:  Spontaneous abortion in first trimester        Sharlene Motts, PA-C 05/17/14 1607

## 2014-05-18 LAB — GC/CHLAMYDIA PROBE AMP
CT PROBE, AMP APTIMA: NEGATIVE
GC PROBE AMP APTIMA: NEGATIVE

## 2014-10-20 ENCOUNTER — Ambulatory Visit (INDEPENDENT_AMBULATORY_CARE_PROVIDER_SITE_OTHER): Payer: Self-pay | Admitting: Family Medicine

## 2014-10-20 VITALS — BP 120/80 | HR 78 | Temp 98.4°F | Resp 16 | Ht 63.0 in | Wt 178.0 lb

## 2014-10-20 DIAGNOSIS — G43809 Other migraine, not intractable, without status migrainosus: Secondary | ICD-10-CM

## 2014-10-20 DIAGNOSIS — Z2089 Contact with and (suspected) exposure to other communicable diseases: Secondary | ICD-10-CM

## 2014-10-20 DIAGNOSIS — Z20828 Contact with and (suspected) exposure to other viral communicable diseases: Secondary | ICD-10-CM

## 2014-10-20 DIAGNOSIS — F439 Reaction to severe stress, unspecified: Secondary | ICD-10-CM

## 2014-10-20 DIAGNOSIS — Z658 Other specified problems related to psychosocial circumstances: Secondary | ICD-10-CM

## 2014-10-20 DIAGNOSIS — R05 Cough: Secondary | ICD-10-CM

## 2014-10-20 DIAGNOSIS — F418 Other specified anxiety disorders: Secondary | ICD-10-CM

## 2014-10-20 DIAGNOSIS — R059 Cough, unspecified: Secondary | ICD-10-CM

## 2014-10-20 MED ORDER — ALPRAZOLAM 1 MG PO TABS: 0.5000 mg | ORAL_TABLET | Freq: Two times a day (BID) | ORAL | Status: DC | PRN

## 2014-10-20 MED ORDER — AZITHROMYCIN 250 MG PO TABS: ORAL_TABLET | ORAL | Status: DC

## 2014-10-20 MED ORDER — ELETRIPTAN HYDROBROMIDE 20 MG PO TABS: 20.0000 mg | ORAL_TABLET | ORAL | Status: DC | PRN

## 2014-10-20 NOTE — Progress Notes (Addendum)
Subjective:    Patient ID: Heather Lin, female    DOB: [birth year], 34 y.o.   MRN: [id number] This chart was scribed for Merri Ray, MD by Marti Sleigh, Medical Scribe. This patient was seen in Room 1 and the patient's care was started a 4:47 PM.  Chief Complaint  Patient presents with  . Cough    Congestion with yellow mucus x 3 days  . Migraine    HPI HPI Comments: Heather Lin is a 34 y.o. female who presents to [facility] complaining of cough productive of yellow sputum for the last 3-4 days. Pt states her daughter has pneumonia right now, and has been on medication for the last five days. Pt states she is going on Vacation on Friday and does not want to be sick on vacation. Pt denies fever, chills. Pt states she does not have insurance right now, and will not be insured again until November.  Pt is also complaining of migraines, which she associates with increased stress. Pt states she has not had migraines in a long time, but had a migraine two weeks ago. Pt states the Ralpax she was prescribed previously worked, though it made her feel "like she was run over by a bus the next day." Pt states she only used two of the Ralpax in the past.  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 Pt states she has been prescribed lexapro, klonopin or xanax and other medications in the past for her anxiety. Pt states she went through a 30 day prescription in six months. Pt states she has not increased her alcohol consumption. Denies depression sx's.   Notes from Previous HPIs: HA, pt was seen by dr Ouida Sills in October 2013, reported 4 migraines per year at that time with some in changes in regularity possibly due to changes in OCP. Pt was treated with Relpax.    Patient Active Problem List   Diagnosis Date Noted  . Acute appendicitis, uncomplicated 32/09/3341  . Migraine 06/12/2012    . Neck pain 01/10/2012  . Lumbar strain 01/10/2012  . DIZZINESS 07/18/2009  . PALPITATIONS 07/05/2009  . SHORTNESS OF BREATH 07/05/2009   Past Medical History  Diagnosis Date  . Allergy   . Arthritis   . Depression   . Anxiety   . Migraine    Past Surgical History  Procedure Laterality Date  . Laparoscopic appendectomy N/A 11/26/2012    Procedure: APPENDECTOMY LAPAROSCOPIC;  Surgeon: Stark Klein, MD;  Location: [facility];  Service: General;  Laterality: N/A;  . Appendectomy     Allergies  Allergen Reactions  . Dilaudid [Hydromorphone Hcl] Hives    Can take with benadryl   Prior to Admission medications   Medication Sig Start Date End Date Taking? Authorizing Provider  acetaminophen (TYLENOL) 500 MG tablet Take 1,000 mg by mouth every 6 (six) hours as needed for mild pain or moderate pain.   Yes Historical Provider, MD  Prenatal Vit-Fe Fumarate-FA (PRENATAL MULTIVITAMIN) TABS tablet Take 1 tablet by mouth daily at 12 noon.   Yes Historical Provider, MD  Docosahexaenoic Acid (DHA PO) Take 1 capsule by mouth daily.    Historical Provider, MD   History   Social History  . Marital Status: Married    Spouse Name: N/A  . Number of Children: N/A  . Years of Education: N/A   Occupational History  .  Not on file.   Social History Main Topics  . Smoking status: Current Every Day Smoker -- 1.00 packs/day for .8 years    Types: Pipe  . Smokeless tobacco: Not on file  . Alcohol Use: No  . Drug Use: No  . Sexual Activity: Not on file   Other Topics Concern  . Not on file   Social History Narrative    Review of Systems  Constitutional: Negative for fever and chills.  Respiratory: Positive for cough. Negative for shortness of breath.   Neurological: Positive for headaches.  Psychiatric/Behavioral: Negative for dysphoric mood. The patient is nervous/anxious.       Objective:   Physical Exam  Constitutional: She is oriented to person, place, and time. She appears  well-developed and well-nourished. No distress.  HENT:  Head: Normocephalic and atraumatic.  Right Ear: Hearing, tympanic membrane, external ear and ear canal normal.  Left Ear: Hearing, tympanic membrane, external ear and ear canal normal.  Nose: Nose normal.  Mouth/Throat: Oropharynx is clear and moist. No oropharyngeal exudate.  Eyes: Conjunctivae and EOM are normal. Pupils are equal, round, and reactive to light.  Cardiovascular: Normal rate, regular rhythm, normal heart sounds and intact distal pulses.   No murmur heard. Pulmonary/Chest: Effort normal and breath sounds normal. No respiratory distress. She has no wheezes. She has no rhonchi.  Lungs are clear.  Neurological: She is alert and oriented to person, place, and time. She has normal strength. No sensory deficit. Coordination and gait normal.  Nonfocal. No pronator drift, normal heel to toe and finger to nose.   Skin: Skin is warm and dry. No rash noted.  Psychiatric: She has a normal mood and affect. Her behavior is normal.  Vitals reviewed.   Filed Vitals:   10/20/14 1633  BP: 120/80  Pulse: 78  Temp: 98.4 F (36.9 C)  TempSrc: Oral  Resp: 16  Height: $Remove'5\' 3"'lxWcUgC$  (1.6 m)  Weight: 178 lb (80.74 kg)  SpO2: 98%       Assessment & Plan:   Heather Lin is a 34 y.o. female Stress, Situational anxiety - Plan: ALPRAZolam (XANAX) 1 MG tablet  - exercise and stress mgt techniques discussed. Can also follow up with counselor if needed. Will provide rx for xanax at prior dose if needed for breakthrough symptoms, but consider SSRI if persistent need for meds or worsening anxiety.   Cough - Plan: azithromycin (ZITHROMAX) 250 MG tablet, Exposure to pneumonia - Plan: azithromycin (ZITHROMAX) 250 MG tablet  -clear, reassuring exam. Sx care with mucinex or mucinex DM, but if any worsening or cough not improved in next week - can fill Zpak. RTC/ER precautions.   Other migraine without status migrainosus, not intractable - Plan:  eletriptan (RELPAX) 20 MG tablet  -infrequent.  Stress component likely. Stress mgt as above, but if needed - relpax refilled. rtc precautions.    Meds ordered this encounter  Medications  . ALPRAZolam (XANAX) 1 MG tablet    Sig: Take 0.5 tablets (0.5 mg total) by mouth 2 (two) times daily as needed for anxiety.    Dispense:  30 tablet    Refill:  0  . azithromycin (ZITHROMAX) 250 MG tablet    Sig: Take 2 pills by mouth on day 1, then 1 pill by mouth per day on days 2 through 5.    Dispense:  6 tablet    Refill:  0  . eletriptan (RELPAX) 20 MG tablet    Sig: Take 1 tablet (20 mg  total) by mouth as needed for migraine or headache. One tablet by mouth at onset of headache. May repeat in 2 hours if headache persists or recurs.    Dispense:  10 tablet    Refill:  0   Patient Instructions  Saline nasal spray atleast 4 times per day, over the counter mucinex or mucinex DM, drink plenty of fluids. If not improving in next few days - can start antibiotic (zpak).  Return to the clinic or go to the nearest emergency room if any of your symptoms worsen or new symptoms occur.  I can temporarily prescribe xanax for breakthrough anxiety symptoms.  If you have to use this medicine more frequently, or worsening anxiety - return to discuss anxiety and other medications.   See information below on stress management. I encourage you to see counselor if not improving with these stress management techniques.   I wrote you the Relpax if needed for migraine, but if use of this increases or new types of headaches - return for recheck.   Stress and Stress Management Stress is a normal reaction to life events. It is what you feel when life demands more than you are used to or more than you can handle. Some stress can be useful. For example, the stress reaction can help you catch the last bus of the day, study for a test, or meet a deadline at work. But stress that occurs too often or for too long can cause  problems. It can affect your emotional health and interfere with relationships and normal daily activities. Too much stress can weaken your immune system and increase your risk for physical illness. If you already have a medical problem, stress can make it worse. CAUSES  All sorts of life events may cause stress. An event that causes stress for one person may not be stressful for another person. Major life events commonly cause stress. These may be positive or negative. Examples include losing your job, moving into a new home, getting married, having a baby, or losing a loved one. Less obvious life events may also cause stress, especially if they occur day after day or in combination. Examples include working long hours, driving in traffic, caring for children, being in debt, or being in a difficult relationship. SIGNS AND SYMPTOMS Stress may cause emotional symptoms including, the following:  Anxiety. This is feeling worried, afraid, on edge, overwhelmed, or out of control.  Anger. This is feeling irritated or impatient.  Depression. This is feeling sad, down, helpless, or guilty.  Difficulty focusing, remembering, or making decisions. Stress may cause physical symptoms, including the following:   Aches and pains. These may affect your head, neck, back, stomach, or other areas of your body.  Tight muscles or clenched jaw.  Low energy or trouble sleeping. Stress may cause unhealthy behaviors, including the following:   Eating to feel better (overeating) or skipping meals.  Sleeping too little, too much, or both.  Working too much or putting off tasks (procrastination).  Smoking, drinking alcohol, or using drugs to feel better. DIAGNOSIS  Stress is diagnosed through an assessment by your health care provider. Your health care provider will ask questions about your symptoms and any stressful life events.Your health care provider will also ask about your medical history and may order  blood tests or other tests. Certain medical conditions and medicine can cause physical symptoms similar to stress. Mental illness can cause emotional symptoms and unhealthy behaviors similar to stress. Your health care provider  may refer you to a mental health professional for further evaluation.  TREATMENT  Stress management is the recommended treatment for stress.The goals of stress management are reducing stressful life events and coping with stress in healthy ways.  Techniques for reducing stressful life events include the following:  Stress identification. Self-monitor for stress and identify what causes stress for you. These skills may help you to avoid some stressful events.  Time management. Set your priorities, keep a calendar of events, and learn to say "no." These tools can help you avoid making too many commitments. Techniques for coping with stress include the following:  Rethinking the problem. Try to think realistically about stressful events rather than ignoring them or overreacting. Try to find the positives in a stressful situation rather than focusing on the negatives.  Exercise. Physical exercise can release both physical and emotional tension. The key is to find a form of exercise you enjoy and do it regularly.  Relaxation techniques. These relax the body and mind. Examples include yoga, meditation, tai chi, biofeedback, deep breathing, progressive muscle relaxation, listening to music, being out in nature, journaling, and other hobbies. Again, the key is to find one or more that you enjoy and can do regularly.  Healthy lifestyle. Eat a balanced diet, get plenty of sleep, and do not smoke. Avoid using alcohol or drugs to relax.  Strong support network. Spend time with family, friends, or other people you enjoy being around.Express your feelings and talk things over with someone you trust. Counseling or talktherapy with a mental health professional may be helpful if you are  having difficulty managing stress on your own. Medicine is typically not recommended for the treatment of stress.Talk to your health care provider if you think you need medicine for symptoms of stress. HOME CARE INSTRUCTIONS  Keep all follow-up visits as directed by your health care provider.  Take all medicines as directed by your health care provider. SEEK MEDICAL CARE IF:  Your symptoms get worse or you start having new symptoms.  You feel overwhelmed by your problems and can no longer manage them on your own. SEEK IMMEDIATE MEDICAL CARE IF:  You feel like hurting yourself or someone else. Document Released: 01/30/2001 Document Revised: 12/21/2013 Document Reviewed: 03/31/2013 [facility] Patient Information 2015 [place], [place]. This information is not intended to replace advice given to you by your health care provider. Make sure you discuss any questions you have with your health care provider.  Cough, Adult  A cough is a reflex that helps clear your throat and airways. It can help heal the body or may be a reaction to an irritated airway. A cough may only last 2 or 3 weeks (acute) or may last more than 8 weeks (chronic).  CAUSES Acute cough:  Viral or bacterial infections. Chronic cough:  Infections.  Allergies.  Asthma.  Post-nasal drip.  Smoking.  Heartburn or acid reflux.  Some medicines.  Chronic lung problems (COPD).  Cancer. SYMPTOMS   Cough.  Fever.  Chest pain.  Increased breathing rate.  High-pitched whistling sound when breathing (wheezing).  Colored mucus that you cough up (sputum). TREATMENT   A bacterial cough may be treated with antibiotic medicine.  A viral cough must run its course and will not respond to antibiotics.  Your caregiver may recommend other treatments if you have a chronic cough. HOME CARE INSTRUCTIONS   Only take over-the-counter or prescription medicines for pain, discomfort, or fever as directed by your caregiver.  Use cough suppressants only  as directed by your caregiver.  Use a cold steam vaporizer or humidifier in your bedroom or home to help loosen secretions.  Sleep in a semi-upright position if your cough is worse at night.  Rest as needed.  Stop smoking if you smoke. SEEK IMMEDIATE MEDICAL CARE IF:   You have pus in your sputum.  Your cough starts to worsen.  You cannot control your cough with suppressants and are losing sleep.  You begin coughing up blood.  You have difficulty breathing.  You develop pain which is getting worse or is uncontrolled with medicine.  You have a fever. MAKE SURE YOU:   Understand these instructions.  Will watch your condition.  Will get help right away if you are not doing well or get worse. Document Released: 02/02/2011 Document Revised: 10/29/2011 Document Reviewed: 02/02/2011 [facility] Patient Information 2015 [place], [place]. This information is not intended to replace advice given to you by your health care provider. Make sure you discuss any questions you have with your health care provider.     I personally performed the services described in this documentation, which was scribed in my presence. The recorded information has been reviewed and considered, and addended by me as needed.

## 2014-10-20 NOTE — Patient Instructions (Addendum)
Saline nasal spray atleast 4 times per day, over the counter mucinex or mucinex DM, drink plenty of fluids. If not improving in next few days - can start antibiotic (zpak).  Return to the clinic or go to the nearest emergency room if any of your symptoms worsen or new symptoms occur.  I can temporarily prescribe xanax for breakthrough anxiety symptoms.  If you have to use this medicine more frequently, or worsening anxiety - return to discuss anxiety and other medications.   See information below on stress management. I encourage you to see counselor if not improving with these stress management techniques.   I wrote you the Relpax if needed for migraine, but if use of this increases or new types of headaches - return for recheck.   Stress and Stress Management Stress is a normal reaction to life events. It is what you feel when life demands more than you are used to or more than you can handle. Some stress can be useful. For example, the stress reaction can help you catch the last bus of the day, study for a test, or meet a deadline at work. But stress that occurs too often or for too long can cause problems. It can affect your emotional health and interfere with relationships and normal daily activities. Too much stress can weaken your immune system and increase your risk for physical illness. If you already have a medical problem, stress can make it worse. CAUSES  All sorts of life events may cause stress. An event that causes stress for one person may not be stressful for another person. Major life events commonly cause stress. These may be positive or negative. Examples include losing your job, moving into a new home, getting married, having a baby, or losing a loved one. Less obvious life events may also cause stress, especially if they occur day after day or in combination. Examples include working long hours, driving in traffic, caring for children, being in debt, or being in a difficult  relationship. SIGNS AND SYMPTOMS Stress may cause emotional symptoms including, the following:  Anxiety. This is feeling worried, afraid, on edge, overwhelmed, or out of control.  Anger. This is feeling irritated or impatient.  Depression. This is feeling sad, down, helpless, or guilty.  Difficulty focusing, remembering, or making decisions. Stress may cause physical symptoms, including the following:   Aches and pains. These may affect your head, neck, back, stomach, or other areas of your body.  Tight muscles or clenched jaw.  Low energy or trouble sleeping. Stress may cause unhealthy behaviors, including the following:   Eating to feel better (overeating) or skipping meals.  Sleeping too little, too much, or both.  Working too much or putting off tasks (procrastination).  Smoking, drinking alcohol, or using drugs to feel better. DIAGNOSIS  Stress is diagnosed through an assessment by your health care provider. Your health care provider will ask questions about your symptoms and any stressful life events.Your health care provider will also ask about your medical history and may order blood tests or other tests. Certain medical conditions and medicine can cause physical symptoms similar to stress. Mental illness can cause emotional symptoms and unhealthy behaviors similar to stress. Your health care provider may refer you to a mental health professional for further evaluation.  TREATMENT  Stress management is the recommended treatment for stress.The goals of stress management are reducing stressful life events and coping with stress in healthy ways.  Techniques for reducing stressful life events include  the following:  Stress identification. Self-monitor for stress and identify what causes stress for you. These skills may help you to avoid some stressful events.  Time management. Set your priorities, keep a calendar of events, and learn to say "no." These tools can help you  avoid making too many commitments. Techniques for coping with stress include the following:  Rethinking the problem. Try to think realistically about stressful events rather than ignoring them or overreacting. Try to find the positives in a stressful situation rather than focusing on the negatives.  Exercise. Physical exercise can release both physical and emotional tension. The key is to find a form of exercise you enjoy and do it regularly.  Relaxation techniques. These relax the body and mind. Examples include yoga, meditation, tai chi, biofeedback, deep breathing, progressive muscle relaxation, listening to music, being out in nature, journaling, and other hobbies. Again, the key is to find one or more that you enjoy and can do regularly.  Healthy lifestyle. Eat a balanced diet, get plenty of sleep, and do not smoke. Avoid using alcohol or drugs to relax.  Strong support network. Spend time with family, friends, or other people you enjoy being around.Express your feelings and talk things over with someone you trust. Counseling or talktherapy with a mental health professional may be helpful if you are having difficulty managing stress on your own. Medicine is typically not recommended for the treatment of stress.Talk to your health care provider if you think you need medicine for symptoms of stress. HOME CARE INSTRUCTIONS  Keep all follow-up visits as directed by your health care provider.  Take all medicines as directed by your health care provider. SEEK MEDICAL CARE IF:  Your symptoms get worse or you start having new symptoms.  You feel overwhelmed by your problems and can no longer manage them on your own. SEEK IMMEDIATE MEDICAL CARE IF:  You feel like hurting yourself or someone else. Document Released: 01/30/2001 Document Revised: 12/21/2013 Document Reviewed: 03/31/2013 [facility] Patient Information 2015 [place], [place]. This information is not intended to replace advice given  to you by your health care provider. Make sure you discuss any questions you have with your health care provider.  Cough, Adult  A cough is a reflex that helps clear your throat and airways. It can help heal the body or may be a reaction to an irritated airway. A cough may only last 2 or 3 weeks (acute) or may last more than 8 weeks (chronic).  CAUSES Acute cough:  Viral or bacterial infections. Chronic cough:  Infections.  Allergies.  Asthma.  Post-nasal drip.  Smoking.  Heartburn or acid reflux.  Some medicines.  Chronic lung problems (COPD).  Cancer. SYMPTOMS   Cough.  Fever.  Chest pain.  Increased breathing rate.  High-pitched whistling sound when breathing (wheezing).  Colored mucus that you cough up (sputum). TREATMENT   A bacterial cough may be treated with antibiotic medicine.  A viral cough must run its course and will not respond to antibiotics.  Your caregiver may recommend other treatments if you have a chronic cough. HOME CARE INSTRUCTIONS   Only take over-the-counter or prescription medicines for pain, discomfort, or fever as directed by your caregiver. Use cough suppressants only as directed by your caregiver.  Use a cold steam vaporizer or humidifier in your bedroom or home to help loosen secretions.  Sleep in a semi-upright position if your cough is worse at night.  Rest as needed.  Stop smoking if you smoke. SEEK  IMMEDIATE MEDICAL CARE IF:   You have pus in your sputum.  Your cough starts to worsen.  You cannot control your cough with suppressants and are losing sleep.  You begin coughing up blood.  You have difficulty breathing.  You develop pain which is getting worse or is uncontrolled with medicine.  You have a fever. MAKE SURE YOU:   Understand these instructions.  Will watch your condition.  Will get help right away if you are not doing well or get worse. Document Released: 02/02/2011 Document Revised: 10/29/2011  Document Reviewed: 02/02/2011 [facility] Patient Information 2015 [place], [place]. This information is not intended to replace advice given to you by your health care provider. Make sure you discuss any questions you have with your health care provider.

## 2014-11-18 ENCOUNTER — Telehealth: Payer: Self-pay | Admitting: Family Medicine

## 2014-11-18 NOTE — Telephone Encounter (Signed)
Heather Lin is a 34 y.o. female Stress, Situational anxiety - Plan: ALPRAZolam (XANAX) 1 MG tablet - exercise and stress mgt techniques discussed. Can also follow up with counselor if needed. Will provide rx for xanax at prior dose if needed for breakthrough symptoms, but consider SSRI if persistent need for meds or worsening anxiety.    Spoke with pt, she states she feels fine and is taking it as prescribed. She no longer has insurance because she is not in school anymore and her insurance cancelled. She feels she will not be dependent on this medication but wants to know how many times she will have to come in to get refills. She does not want to start on an everyday anti-depressant because when she comes off of them she feels more depressed. She feels she does not need this, just something that can help when times get rough in her life. She states she still has some Xanax left and she does not really need it everyday. I think she is worried about the cost of the office visits. Please advise.

## 2014-11-18 NOTE — Telephone Encounter (Signed)
Patient wants to discuss a plan to help treat her anxiety and discuss her medication.   [phone]

## 2014-11-18 NOTE — Telephone Encounter (Signed)
Patient is returning missed phone call. Please call back! Patient states she would like a voicemail if she misses the call. [phone]

## 2014-11-18 NOTE — Telephone Encounter (Signed)
lmom of Dr Paralee CancelGreene's message per pt's request.

## 2014-11-18 NOTE — Telephone Encounter (Signed)
Episodic visits would be necessary for monitoring stability of anxiety symptoms and use of Xanax, especially if not taking SSRI or more frequent use of Xanax. We can refill it 2 more times, but would recommend OV in next 2 months for recheck. Additionally can look into counseling as discussed at last ov.

## 2014-11-18 NOTE — Telephone Encounter (Signed)
lmom to cb. 

## 2014-11-27 ENCOUNTER — Other Ambulatory Visit: Payer: Self-pay | Admitting: Family Medicine

## 2014-11-28 NOTE — Telephone Encounter (Signed)
Refilled, but follow up in next 6 weeks to discuss this med and other options.

## 2014-11-29 NOTE — Telephone Encounter (Signed)
Faxed and notified pt of Rf and need for f/up. Pt agreed and got Dr Paralee CancelGreene's sch for Apr Saturdays.

## 2015-02-12 ENCOUNTER — Telehealth: Payer: Self-pay

## 2015-02-12 NOTE — Telephone Encounter (Addendum)
-  Pt is needing to get a refill on her zanax -she would like [place] to know she does not take them often she just takes them as she needs them and this is why it is so far at times .  Also she gets anxious if she runs out of  Them *    Best number [phone]

## 2015-02-13 ENCOUNTER — Other Ambulatory Visit: Payer: Self-pay | Admitting: Family Medicine

## 2015-02-13 NOTE — Telephone Encounter (Signed)
Refilled. Ready for pickup.  

## 2015-02-15 NOTE — Telephone Encounter (Signed)
Refilled on June 26th. Let me know if this was not received/picked up.

## 2015-02-15 NOTE — Telephone Encounter (Signed)
Called in Rx. Pt notified. 

## 2015-04-19 ENCOUNTER — Other Ambulatory Visit: Payer: Self-pay | Admitting: Family Medicine

## 2015-04-20 NOTE — Telephone Encounter (Signed)
Refilled, but needs OV prior to next refill as last seen in March.

## 2015-04-22 NOTE — Telephone Encounter (Signed)
Phoned in [facility] [place] xanax

## 2015-05-21 ENCOUNTER — Telehealth: Payer: Self-pay

## 2015-05-21 MED ORDER — ALPRAZOLAM 1 MG PO TABS: ORAL_TABLET | ORAL | Status: DC

## 2015-05-21 NOTE — Telephone Encounter (Signed)
Called pt, she would like one more refill and she states she will follow up with Dr. Neva Seat. Please advise.

## 2015-05-21 NOTE — Telephone Encounter (Signed)
ALPRAZolam (XANAX) 1 MG tablet---- Refill request   [facility] [place]    Please call when ready for pick up [phone]

## 2015-05-21 NOTE — Telephone Encounter (Signed)
Shade Flood, MD at 04/20/2015 9:51 PM     Status: Signed       Expand All Collapse All   Refilled, but needs OV prior to next refill as last seen in March.

## 2015-05-21 NOTE — Telephone Encounter (Signed)
As I will be out of office for few weeks, and she needs to follow up with me for this medication, I refilled once more, but no further refills without office visit after this time.

## 2015-05-23 NOTE — Telephone Encounter (Signed)
Called in Rx. Pt was told that this will be last Rx and needs office visit.

## 2015-07-02 ENCOUNTER — Telehealth: Payer: Self-pay | Admitting: Radiology

## 2015-07-02 NOTE — Telephone Encounter (Signed)
[facility] Policy for Prescribing Controlled Substances (Revised 06/2012) 1. Prescriptions for controlled substances will be filled by ONE provider at [facility] with whom you have established and developed a plan for your care, including follow-up. 2. You are encouraged to schedule an appointment with your prescriber at our appointment center for follow-up visits whenever possible. 3. If you request a prescription for the controlled substance while at [facility] an [facility] problem (with someone other than your regular prescriber), you MAY be given a ONE-TIME prescription for a 30-day supply of the controlled substance, to allow time for you to return to see your regular prescriber for additional prescriptions.  Advised patient of our controlled substances policy, it is up to her if she wants to come in for this or not, she is aware she needs visit for the Alprazolam

## 2015-07-09 ENCOUNTER — Ambulatory Visit (INDEPENDENT_AMBULATORY_CARE_PROVIDER_SITE_OTHER): Payer: BLUE CROSS/BLUE SHIELD | Admitting: Family Medicine

## 2015-07-09 VITALS — BP 130/78 | HR 88 | Temp 98.5°F | Resp 16 | Ht 63.0 in | Wt 171.6 lb

## 2015-07-09 DIAGNOSIS — Z658 Other specified problems related to psychosocial circumstances: Secondary | ICD-10-CM | POA: Diagnosis not present

## 2015-07-09 DIAGNOSIS — F439 Reaction to severe stress, unspecified: Secondary | ICD-10-CM

## 2015-07-09 DIAGNOSIS — F418 Other specified anxiety disorders: Secondary | ICD-10-CM

## 2015-07-09 MED ORDER — FLUOXETINE HCL 20 MG PO TABS: 20.0000 mg | ORAL_TABLET | Freq: Every day | ORAL | Status: DC

## 2015-07-09 MED ORDER — ALPRAZOLAM 1 MG PO TABS: ORAL_TABLET | ORAL | Status: DC

## 2015-07-09 NOTE — Patient Instructions (Addendum)
Work on incorporating exercise into your day and other stress management techniques as we discussed and information as below.  Call counselor - let me know if more names needed.  Heather Lin: [phone] Heather Lin: [phone]  If daily stress/anxiety/overwhelmed feeling not improving with exercise, other stress management techniques and counseling - start Prozac once per day.  Ok to continue xanax for now at same dose.  Follow up with me in 3 months.  Return to the clinic or go to the nearest emergency room if any of your symptoms worsen or new symptoms occur.  Stress and Stress Management Stress is a normal reaction to life events. It is what you feel when life demands more than you are used to or more than you can handle. Some stress can be useful. For example, the stress reaction can help you catch the last bus of the day, study for a test, or meet a deadline at work. But stress that occurs too often or for too long can cause problems. It can affect your emotional health and interfere with relationships and normal daily activities. Too much stress can weaken your immune system and increase your risk for physical illness. If you already have a medical problem, stress can make it worse. CAUSES  All sorts of life events may cause stress. An event that causes stress for one person may not be stressful for another person. Major life events commonly cause stress. These may be positive or negative. Examples include losing your job, moving into a new home, getting married, having a baby, or losing a loved one. Less obvious life events may also cause stress, especially if they occur day after day or in combination. Examples include working long hours, driving in traffic, caring for children, being in debt, or being in a difficult relationship. SIGNS AND SYMPTOMS Stress may cause emotional symptoms including, the following:  Anxiety. This is feeling worried, afraid, on edge, overwhelmed, or out of  control.  Anger. This is feeling irritated or impatient.  Depression. This is feeling sad, down, helpless, or guilty.  Difficulty focusing, remembering, or making decisions. Stress may cause physical symptoms, including the following:   Aches and pains. These may affect your head, neck, back, stomach, or other areas of your body.  Tight muscles or clenched jaw.  Low energy or trouble sleeping. Stress may cause unhealthy behaviors, including the following:   Eating to feel better (overeating) or skipping meals.  Sleeping too little, too much, or both.  Working too much or putting off tasks (procrastination).  Smoking, drinking alcohol, or using drugs to feel better. DIAGNOSIS  Stress is diagnosed through an assessment by your health care provider. Your health care provider will ask questions about your symptoms and any stressful life events.Your health care provider will also ask about your medical history and may order blood tests or other tests. Certain medical conditions and medicine can cause physical symptoms similar to stress. Mental illness can cause emotional symptoms and unhealthy behaviors similar to stress. Your health care provider may refer you to a mental health professional for further evaluation.  TREATMENT  Stress management is the recommended treatment for stress.The goals of stress management are reducing stressful life events and coping with stress in healthy ways.  Techniques for reducing stressful life events include the following:  Stress identification. Self-monitor for stress and identify what causes stress for you. These skills may help you to avoid some stressful events.  Time management. Set your priorities, keep a calendar of  events, and learn to say "no." These tools can help you avoid making too many commitments. Techniques for coping with stress include the following:  Rethinking the problem. Try to think realistically about stressful events rather  than ignoring them or overreacting. Try to find the positives in a stressful situation rather than focusing on the negatives.  Exercise. Physical exercise can release both physical and emotional tension. The key is to find a form of exercise you enjoy and do it regularly.  Relaxation techniques. These relax the body and mind. Examples include yoga, meditation, tai chi, biofeedback, deep breathing, progressive muscle relaxation, listening to music, being out in nature, journaling, and other hobbies. Again, the key is to find one or more that you enjoy and can do regularly.  Healthy lifestyle. Eat a balanced diet, get plenty of sleep, and do not smoke. Avoid using alcohol or drugs to relax.  Strong support network. Spend time with family, friends, or other people you enjoy being around.Express your feelings and talk things over with someone you trust. Counseling or talktherapy with a mental health professional may be helpful if you are having difficulty managing stress on your own. Medicine is typically not recommended for the treatment of stress.Talk to your health care provider if you think you need medicine for symptoms of stress. HOME CARE INSTRUCTIONS  Keep all follow-up visits as directed by your health care provider.  Take all medicines as directed by your health care provider. SEEK MEDICAL CARE IF:  Your symptoms get worse or you start having new symptoms.  You feel overwhelmed by your problems and can no longer manage them on your own. SEEK IMMEDIATE MEDICAL CARE IF:  You feel like hurting yourself or someone else.   This information is not intended to replace advice given to you by your health care provider. Make sure you discuss any questions you have with your health care provider.   Document Released: 01/30/2001 Document Revised: 08/27/2014 Document Reviewed: 03/31/2013 [employer] Interactive Patient Education [employer].

## 2015-07-09 NOTE — Progress Notes (Signed)
Subjective:  This chart was scribed for Heather Ray, MD by [facility], medical scribe at [facility].The patient was seen in exam room 08 and the patient's care was started at 4:25 PM.   Patient ID: Heather Lin, female    DOB: 1981/08/14, 34 y.o.   MRN: [id number] Chief Complaint  Patient presents with  . Medication Refill    alprazolam   HPI HPI Comments: Heather Lin is a 34 y.o. female who presents to [facility] for a medication refill.  Last seen by me in March. Endorsed a past hx of anxiety, which has increased to do stress in her life. Treated with lexapro, klonopin and xanax in the past. Given info on stress and stress management at that visit also prescribed Xanax 0.5 mg as needed. Recommended a recheck within 2 months on March 31 st phone call when requesting a refill on xanax. Next refill on June 25 th, again refilled on Oct 1st with instructions to discuss further. Most recent xanax refill March 2nd, April 10, June 26, Aug 31, and Oct 1st each for #30.   Today, doing pretty well. Finished school, bought a house, new position in work involving more responsibilities. Project due next Wednesday so she is very stressed. Not doing much to manage her stress. Trouble sleeping at night. Taking valerian root. On stressor in her life is her mother-in-law and will take a xanax to when around her. Will also take a xanax at night to wind down. Close to daily use of the xanax. Did not like lexapro in the past. She does not exercise.   Depression screen PHQ 2/9 07/09/2015  Decreased Interest 0  Down, Depressed, Hopeless 0  PHQ - 2 Score 0      Patient Active Problem List   Diagnosis Date Noted  . Acute appendicitis, uncomplicated 42/59/5638  . Migraine 06/12/2012  . Neck pain 01/10/2012  . Lumbar strain 01/10/2012  . DIZZINESS 07/18/2009  . PALPITATIONS 07/05/2009  . SHORTNESS OF BREATH 07/05/2009   Past Medical History    Diagnosis Date  . Allergy   . Arthritis   . Depression   . Anxiety   . Migraine    Past Surgical History  Procedure Laterality Date  . Laparoscopic appendectomy N/A 11/26/2012    Procedure: APPENDECTOMY LAPAROSCOPIC;  Surgeon: Stark Klein, MD;  Location: [facility];  Service: General;  Laterality: N/A;  . Appendectomy     Allergies  Allergen Reactions  . Dilaudid [Hydromorphone Hcl] Hives    Can take with benadryl   Prior to Admission medications   Medication Sig Start Date End Date Taking? Authorizing Provider  ALPRAZolam Duanne Moron) 1 MG tablet TAKE 1/2 TABLET BY MOUTH TWICE DAILY AS NEEDED FOR ANXIETY 05/21/15  Yes Wendie Agreste, MD  eletriptan (RELPAX) 20 MG tablet Take 1 tablet (20 mg total) by mouth as needed for migraine or headache. One tablet by mouth at onset of headache. May repeat in 2 hours if headache persists or recurs. 10/20/14  Yes Wendie Agreste, MD  Docosahexaenoic Acid (DHA PO) Take 1 capsule by mouth daily.    Historical Provider, MD  Prenatal Vit-Fe Fumarate-FA (PRENATAL MULTIVITAMIN) TABS tablet Take 1 tablet by mouth daily at 12 noon.    Historical Provider, MD   Social History   Social History  . Marital Status: Married    Spouse Name: N/A  . Number of Children: N/A  . Years of Education: N/A  Occupational History  . Not on file.   Social History Main Topics  . Smoking status: Never Smoker   . Smokeless tobacco: Not on file  . Alcohol Use: No  . Drug Use: No  . Sexual Activity: Not on file   Other Topics Concern  . Not on file   Social History Narrative   Review of Systems  Psychiatric/Behavioral: Positive for sleep disturbance. The patient is nervous/anxious.       Objective:  BP 130/78 mmHg  Pulse 88  Temp(Src) 98.5 F (36.9 C) (Oral)  Resp 16  Ht _0  (1.6 m)  Wt 171 lb 9.6 oz (77.837 kg)  BMI 30.41 kg/m2  SpO2 98%  LMP 06/21/2015 Physical Exam  Constitutional: She is oriented to person, place, and time. She appears  well-developed and well-nourished. No distress.  HENT:  Head: Normocephalic and atraumatic.  Eyes: Pupils are equal, round, and reactive to light.  Neck: Normal range of motion.  Cardiovascular: Normal rate, regular rhythm and normal heart sounds.   Pulmonary/Chest: Effort normal and breath sounds normal. No respiratory distress.  Musculoskeletal: Normal range of motion.  Neurological: She is alert and oriented to person, place, and time.  Skin: Skin is warm and dry.  Psychiatric: She has a normal mood and affect. Her behavior is normal.  Nursing note and vitals reviewed.     Assessment & Plan:   [name] is a 34 y.o. female Situational anxiety - Plan: FLUoxetine (PROZAC) 20 MG tablet, ALPRAZolam (XANAX) 1 MG tablet  Stress - Plan: FLUoxetine (PROZAC) 20 MG tablet, ALPRAZolam (XANAX) 1 MG tablet   still with situational anxiety/stress, with overwhelmed feelings. Actually increase stressors compared to her visit in March. Still fairly frequent use/need of alprazolam.   -discussed possible Need for SSRI, and can start with Prozac 20 mg QD.  However, can start with working on stress management techniques including exercise and others in the handout.  - Also recommended setting up an appointment with therapist , phone numbers provided.  - If she is having persistent symptoms in spite of the stress management techniques and meeting with counselor, would start Prozac 20 mg daily - sent to pharmacy, can place on hold for now. Side effects  Discussed, and plan on follow-up in 3 months.   -Refilled Xanax for #30 with 1 refill, and based on most recent use this should last until her visit with me in 3 months. RTC precautions if worsening sooner.  Meds ordered this encounter  Medications  . FLUoxetine (PROZAC) 20 MG tablet    Sig: Take 1 tablet (20 mg total) by mouth daily.    Dispense:  30 tablet    Refill:  2  . ALPRAZolam (XANAX) 1 MG tablet    Sig: TAKE 1/2 TABLET BY MOUTH TWICE  DAILY AS NEEDED FOR ANXIETY    Dispense:  30 tablet    Refill:  1   Patient Instructions  Work on incorporating exercise into your day and other stress management techniques as we discussed and information as below.  Call counselor - let me know if more names needed.  Vivia Budge: [phone] Arvil Chaco: [phone]  If daily stress/anxiety/overwhelmed feeling not improving with exercise, other stress management techniques and counseling - start Prozac once per day.  Ok to continue xanax for now at same dose.  Follow up with me in 3 months.  Return to the clinic or go to the nearest emergency room if any of your symptoms worsen or new  symptoms occur.  Stress and Stress Management Stress is a normal reaction to life events. It is what you feel when life demands more than you are used to or more than you can handle. Some stress can be useful. For example, the stress reaction can help you catch the last bus of the day, study for a test, or meet a deadline at work. But stress that occurs too often or for too long can cause problems. It can affect your emotional health and interfere with relationships and normal daily activities. Too much stress can weaken your immune system and increase your risk for physical illness. If you already have a medical problem, stress can make it worse. CAUSES  All sorts of life events may cause stress. An event that causes stress for one person may not be stressful for another person. Major life events commonly cause stress. These may be positive or negative. Examples include losing your job, moving into a new home, getting married, having a baby, or losing a loved one. Less obvious life events may also cause stress, especially if they occur day after day or in combination. Examples include working long hours, driving in traffic, caring for children, being in debt, or being in a difficult relationship. SIGNS AND SYMPTOMS Stress may cause emotional symptoms including, the  following:  Anxiety. This is feeling worried, afraid, on edge, overwhelmed, or out of control.  Anger. This is feeling irritated or impatient.  Depression. This is feeling sad, down, helpless, or guilty.  Difficulty focusing, remembering, or making decisions. Stress may cause physical symptoms, including the following:   Aches and pains. These may affect your head, neck, back, stomach, or other areas of your body.  Tight muscles or clenched jaw.  Low energy or trouble sleeping. Stress may cause unhealthy behaviors, including the following:   Eating to feel better (overeating) or skipping meals.  Sleeping too little, too much, or both.  Working too much or putting off tasks (procrastination).  Smoking, drinking alcohol, or using drugs to feel better. DIAGNOSIS  Stress is diagnosed through an assessment by your health care provider. Your health care provider will ask questions about your symptoms and any stressful life events.Your health care provider will also ask about your medical history and may order blood tests or other tests. Certain medical conditions and medicine can cause physical symptoms similar to stress. Mental illness can cause emotional symptoms and unhealthy behaviors similar to stress. Your health care provider may refer you to a mental health professional for further evaluation.  TREATMENT  Stress management is the recommended treatment for stress.The goals of stress management are reducing stressful life events and coping with stress in healthy ways.  Techniques for reducing stressful life events include the following:  Stress identification. Self-monitor for stress and identify what causes stress for you. These skills may help you to avoid some stressful events.  Time management. Set your priorities, keep a calendar of events, and learn to say "no." These tools can help you avoid making too many commitments. Techniques for coping with stress include the  following:  Rethinking the problem. Try to think realistically about stressful events rather than ignoring them or overreacting. Try to find the positives in a stressful situation rather than focusing on the negatives.  Exercise. Physical exercise can release both physical and emotional tension. The key is to find a form of exercise you enjoy and do it regularly.  Relaxation techniques. These relax the body and mind. Examples include yoga, meditation,  tai chi, biofeedback, deep breathing, progressive muscle relaxation, listening to music, being out in nature, journaling, and other hobbies. Again, the key is to find one or more that you enjoy and can do regularly.  Healthy lifestyle. Eat a balanced diet, get plenty of sleep, and do not smoke. Avoid using alcohol or drugs to relax.  Strong support network. Spend time with family, friends, or other people you enjoy being around.Express your feelings and talk things over with someone you trust. Counseling or talktherapy with a mental health professional may be helpful if you are having difficulty managing stress on your own. Medicine is typically not recommended for the treatment of stress.Talk to your health care provider if you think you need medicine for symptoms of stress. HOME CARE INSTRUCTIONS  Keep all follow-up visits as directed by your health care provider.  Take all medicines as directed by your health care provider. SEEK MEDICAL CARE IF:  Your symptoms get worse or you start having new symptoms.  You feel overwhelmed by your problems and can no longer manage them on your own. SEEK IMMEDIATE MEDICAL CARE IF:  You feel like hurting yourself or someone else.   This information is not intended to replace advice given to you by your health care provider. Make sure you discuss any questions you have with your health care provider.   Document Released: 01/30/2001 Document Revised: 08/27/2014 Document Reviewed: 03/31/2013 Elsevier  Interactive Patient Education [employer].        By signing my name below, I, Nadim Abuhashem, attest that this documentation has been prepared under the direction and in the presence of Heather Ray, MD.  Electronically Signed: Lora Havens, medical scribe. 07/09/2015, 4:42 PM.

## 2015-09-07 ENCOUNTER — Telehealth: Payer: Self-pay

## 2015-09-07 ENCOUNTER — Other Ambulatory Visit: Payer: Self-pay | Admitting: Family Medicine

## 2015-09-07 DIAGNOSIS — F439 Reaction to severe stress, unspecified: Secondary | ICD-10-CM

## 2015-09-07 DIAGNOSIS — F418 Other specified anxiety disorders: Secondary | ICD-10-CM

## 2015-09-07 MED ORDER — ALPRAZOLAM 1 MG PO TABS: ORAL_TABLET | ORAL | Status: DC

## 2015-09-07 NOTE — Telephone Encounter (Signed)
If daily stress/anxiety/overwhelmed feeling not improving with exercise, other stress management techniques and counseling - start Prozac once per day.  Ok to continue xanax for now at same dose.  Follow up with me in 3 months.  Return to the clinic or go to the nearest emergency room if any of your symptoms worsen or new symptoms occur.   Left message to call back.

## 2015-09-07 NOTE — Telephone Encounter (Signed)
Patient is calling to request a refill for alprozalam sent to [facility] on [address]

## 2015-09-07 NOTE — Telephone Encounter (Signed)
Refilled, but will need OV before next refill.

## 2015-09-08 NOTE — Telephone Encounter (Signed)
Called in and LM for pt advising of Rf and need for f/up.

## 2015-10-05 ENCOUNTER — Other Ambulatory Visit: Payer: Self-pay | Admitting: Family Medicine

## 2015-11-26 ENCOUNTER — Ambulatory Visit (HOSPITAL_COMMUNITY)
Admission: EM | Admit: 2015-11-26 | Discharge: 2015-11-26 | Disposition: A | Discharge status: Home / Self Care | Payer: BLUE CROSS/BLUE SHIELD | Attending: Family Medicine | Admitting: Family Medicine

## 2015-11-26 ENCOUNTER — Encounter (HOSPITAL_COMMUNITY): Payer: Self-pay

## 2015-11-26 DIAGNOSIS — G43009 Migraine without aura, not intractable, without status migrainosus: Secondary | ICD-10-CM

## 2015-11-26 MED ORDER — ONDANSETRON 4 MG PO TBDP
ORAL_TABLET | ORAL | Status: AC
  Filled 2015-11-26: qty 1

## 2015-11-26 MED ORDER — ONDANSETRON 4 MG PO TBDP
4.0000 mg | ORAL_TABLET | Freq: Once | ORAL | Status: AC
  Administered 2015-11-26: 4 mg via ORAL

## 2015-11-26 MED ORDER — SUMATRIPTAN SUCCINATE 6 MG/0.5ML ~~LOC~~ SOLN
SUBCUTANEOUS | Status: AC
  Filled 2015-11-26: qty 0.5

## 2015-11-26 MED ORDER — SUMATRIPTAN SUCCINATE 6 MG/0.5ML ~~LOC~~ SOLN
6.0000 mg | Freq: Once | SUBCUTANEOUS | Status: AC
  Administered 2015-11-26: 6 mg via SUBCUTANEOUS

## 2015-11-26 MED ORDER — KETOROLAC TROMETHAMINE 30 MG/ML IJ SOLN
30.0000 mg | Freq: Once | INTRAMUSCULAR | Status: AC
  Administered 2015-11-26: 30 mg via INTRAMUSCULAR

## 2015-11-26 MED ORDER — KETOROLAC TROMETHAMINE 30 MG/ML IJ SOLN
INTRAMUSCULAR | Status: AC
  Filled 2015-11-26: qty 1

## 2015-11-26 NOTE — Discharge Instructions (Signed)
It is a pleasure to see you today for migraine.   You received an injection of KETOROLAC 30mg  intramuscularly in the urgent care today. Also, ONDANSETRON 4mg  by mouth.   You received an injection of SUMATRIPTAN 6mg  subcutaneously as well.    You may take your sumatriptan up to 200mg  in a 24-hour period, as needed for headache.  Do not take another dose unless needed in the morning.   If you are using the 9 tablets of sumatriptan in less than a month, you may want to talk with your primary doctor about daily medicines which can reduce the frequency and intensity of migraines.

## 2015-11-26 NOTE — ED Provider Notes (Addendum)
CSN: [id number]     Arrival date & time 11/26/15  1743 History   First MD Initiated Contact with Patient 11/26/15 1846     Chief Complaint  Patient presents with  . Migraine   (Consider location/radiation/quality/duration/timing/severity/associated sxs/prior Treatment) Patient is a 35 y.o. female presenting with migraines. The history is provided by the patient. No language interpreter was used.  Migraine  Patient with history of chronic migraines since adolescence, comes in for complaint of L sided headache that is similar in character and intensity to her usual migraines.  Started her menses today and believes this is associated.  Migraines associated with photophobia and nausea, which she has today.  Takes sumatriptan , which she took at 3am this morning and gave some relief. Also takes butalbital/acetaminophen two to three times daily with some relief.   ROS: no fevers or chills, no neck pain, no vomiting, no abd pain.  No chest pain or shortness of breath. No known renal or hepatic impairment.   Past Medical History  Diagnosis Date  . Allergy   . Arthritis   . Depression   . Anxiety   . Migraine    Past Surgical History  Procedure Laterality Date  . Laparoscopic appendectomy N/A 11/26/2012    Procedure: APPENDECTOMY LAPAROSCOPIC;  Surgeon: Almond Lint, MD;  Location: [facility];  Service: General;  Laterality: N/A;  . Appendectomy     Family History  Problem Relation Age of Onset  . Cancer Maternal Grandmother   . Anuerysm Maternal Grandfather   . Alzheimer's disease Paternal Grandmother    Social History  Substance Use Topics  . Smoking status: Never Smoker   . Smokeless tobacco: Never Used  . Alcohol Use: 0.0 oz/week    0 Standard drinks or equivalent per week     Comment: occasional   OB History    No data available     Review of Systems  Allergies  Dilaudid  Home Medications   Prior to Admission medications   Medication Sig Start Date End Date Taking?  Authorizing Provider  ALPRAZolam Prudy Feeler) 1 MG tablet TAKE 1/2 TABLET BY MOUTH TWICE DAILY AS NEEDED FOR ANXIETY 09/07/15  Yes Shade Flood, MD  Docosahexaenoic Acid (DHA PO) Take 1 capsule by mouth daily.   Yes Historical Provider, MD  eletriptan (RELPAX) 20 MG tablet Take 1 tablet (20 mg total) by mouth as needed for migraine or headache. One tablet by mouth at onset of headache. May repeat in 2 hours if headache persists or recurs. 10/20/14  Yes Shade Flood, MD  FLUoxetine (PROZAC) 20 MG tablet TAKE 1 TABLET BY MOUTH DAILY 10/05/15  Yes Shade Flood, MD  Prenatal Vit-Fe Fumarate-FA (PRENATAL MULTIVITAMIN) TABS tablet Take 1 tablet by mouth daily at 12 noon.   Yes Historical Provider, MD   Meds Ordered and Administered this Visit   Medications  ketorolac (TORADOL) 30 MG/ML injection 30 mg (not administered)  ondansetron (ZOFRAN-ODT) disintegrating tablet 4 mg (not administered)    BP 127/85 mmHg  Pulse 70  Temp(Src) 98 F (36.7 C) (Oral)  SpO2 100%  LMP 11/25/2015 (Exact Date) No data found.   Physical Exam  Constitutional: She appears well-developed and well-nourished. No distress.  Requests lights dimmed for visit.   HENT:  Head: Normocephalic and atraumatic.  Mouth/Throat: Oropharynx is clear and moist. No oropharyngeal exudate.  Eyes: Conjunctivae and EOM are normal. Pupils are equal, round, and reactive to light.  Neck: Normal range of motion. Neck supple.  Cardiovascular:  Normal rate, regular rhythm and normal heart sounds.   Pulmonary/Chest: Effort normal and breath sounds normal. No respiratory distress. She has no wheezes. She has no rales. She exhibits no tenderness.  Lymphadenopathy:    She has no cervical adenopathy.  Skin: She is not diaphoretic.    ED Course  Procedures (including critical care time)  Labs Review Labs Reviewed - No data to display  Imaging Review No results found.   Visual Acuity Review  Right Eye Distance:   Left Eye  Distance:   Bilateral Distance:    Right Eye Near:   Left Eye Near:    Bilateral Near:         MDM  No diagnosis found. Patient with typical migraine, in keeping with her usual migraines. She reports that often she will use her nine Sumatriptan tablets in less than a month.  I have discussed with her that she may want to talk with her primary doctor about preventive therapy to lessen the frequency and intensity of migraines.   Receiving ketorolac 30mg  IM and ondansetron 4mg  po today in [facility].  Also imitrex 6mg  subQ.   19:48pm: Patient re-evaluated, reports feeling much better. Not drowsy.  Feels that she is able to go home. Discussed follow up, not to take her triptan again this evening.  May want to discuss preventive therapies with her primary doctor.   Paula ComptonJames Jaking Thayer, MD    Barbaraann BarthelJames O Wylee Dorantes, MD 11/26/15 [phone]  Barbaraann BarthelJames O Abubakar Crispo, MD 11/26/15 [id number]

## 2015-11-26 NOTE — ED Notes (Signed)
Patient presents with chronic migraine and she takes medication which she has been taking regularly but today is suffering with more, patient is asking to be treated with some medication that she can take in conjunction with current medication.  no acute distress

## 2015-12-07 ENCOUNTER — Other Ambulatory Visit: Payer: Self-pay

## 2015-12-07 NOTE — Telephone Encounter (Signed)
[facility] on [address] [phone] is calling to request a refill on fluoxetine  Best number [phone]

## 2015-12-08 MED ORDER — FLUOXETINE HCL 20 MG PO TABS: 20.0000 mg | ORAL_TABLET | Freq: Every day | ORAL | Status: DC

## 2015-12-08 NOTE — Telephone Encounter (Signed)
Spoke with pt, she states she will follow up in the next month. She will not have her medication for tomorrow.

## 2015-12-08 NOTE — Telephone Encounter (Signed)
If daily stress/anxiety/overwhelmed feeling not improving with exercise, other stress management techniques and counseling - start Prozac once per day.  Ok to continue xanax for now at same dose.  Follow up with me in 3 months.  Return to the clinic or go to the nearest emergency room if any of your symptoms worsen or new symptoms occur.    LM to call back with follow up plans.

## 2015-12-08 NOTE — Telephone Encounter (Signed)
Sounds good, follow-up next month as planned. Rx sent.

## 2016-08-02 LAB — HM PAP SMEAR

## 2016-10-30 DIAGNOSIS — Z832 Family history of diseases of the blood and blood-forming organs and certain disorders involving the immune mechanism: Secondary | ICD-10-CM | POA: Insufficient documentation

## 2017-01-07 LAB — OB RESULTS CONSOLE HIV ANTIBODY (ROUTINE TESTING): HIV: NONREACTIVE

## 2017-01-07 LAB — OB RESULTS CONSOLE RPR: RPR: NONREACTIVE

## 2017-01-07 LAB — OB RESULTS CONSOLE GC/CHLAMYDIA
CHLAMYDIA, DNA PROBE: NEGATIVE
GC PROBE AMP, GENITAL: NEGATIVE

## 2017-01-07 LAB — OB RESULTS CONSOLE HEPATITIS B SURFACE ANTIGEN: Hepatitis B Surface Ag: NEGATIVE

## 2017-01-07 LAB — OB RESULTS CONSOLE RUBELLA ANTIBODY, IGM: RUBELLA: IMMUNE

## 2017-04-16 ENCOUNTER — Encounter (HOSPITAL_COMMUNITY): Payer: Self-pay | Admitting: *Deleted

## 2017-04-16 ENCOUNTER — Ambulatory Visit (HOSPITAL_COMMUNITY)
Admission: RE | Admit: 2017-04-16 | Discharge: 2017-04-16 | Disposition: A | Discharge status: Home / Self Care | Payer: BLUE CROSS/BLUE SHIELD | Source: Ambulatory Visit | Attending: Obstetrics and Gynecology | Admitting: Obstetrics and Gynecology

## 2017-04-16 DIAGNOSIS — Z3A20 20 weeks gestation of pregnancy: Secondary | ICD-10-CM | POA: Insufficient documentation

## 2017-04-16 DIAGNOSIS — Z8249 Family history of ischemic heart disease and other diseases of the circulatory system: Secondary | ICD-10-CM | POA: Diagnosis present

## 2017-04-16 NOTE — Progress Notes (Signed)
MATERNAL FETAL MEDICINE CONSULT  Patient Name: Heather Lin Medical Record Number:  [id number] Date of Birth: Nov 16, 1980 Requesting Physician Name:  Osborn Coho, MD Date of Service: 04/16/2017  Chief Complaint Family history of DVT  History of Present Illness EKRAM BURDICK was seen today at the request of Osborn Coho, MD.  The patient is a 36 y.o. [id number],EE [redacted]w[redacted]d with an EDD of 08/30/2017, by Last Menstrual Period dating method whose mother had DVTs after 2 of her pregnancies.  Ms. Rocha has no personal history of DVT or PE and does not believe her mother has had any thrombophilia testing.  She has had three term vaginal deliveries without complication and 2 spontaneous abortions at 6 and 9 weeks of gestation.  She had preeclampsia in her last term pregnancy, but has been normotensive since soon after that delivery.  Review of Systems Pertinent items are noted in HPI.  Obstetric History G1--NSVD at 38 weeks in 2001 G2--NSVD at 40 weeks in 2004 G3--SAB at 6 weeks in 2006 G4--NSVD at 4 0weeks in 2010 G5--SAB at 9 weeks in 2015  Past Medical History:  Diagnosis Date  . Allergy   . Anxiety   . Depression   . Migraine     Past Surgical History:  Procedure Laterality Date  . APPENDECTOMY    . LAPAROSCOPIC APPENDECTOMY N/A 11/26/2012   Procedure: APPENDECTOMY LAPAROSCOPIC;  Surgeon: Almond Lint, MD;  Location: [facility];  Service: General;  Laterality: N/A;    Social History   Social History  . Marital status: Married    Spouse name: N/A  . Number of children: N/A  . Years of education: N/A   Social History Main Topics  . Smoking status: Never Smoker  . Smokeless tobacco: Never Used  . Alcohol use No     Comment: occasional  . Drug use: No  . Sexual activity: Yes    Birth control/ protection: None   Other Topics Concern  . None   Social History Narrative  . None    Family History  Problem Relation Age of Onset  . Cancer Maternal Grandmother   .  Anuerysm Maternal Grandfather   . Alzheimer's disease Paternal Grandmother    In addition, the patient has no family history of mental retardation, birth defects, or genetic diseases.  Physical Examination Vitals:   04/16/17 1313  BP: 136/69  Pulse: (!) 104   General appearance - alert, well appearing, and in no distress Mental status - alert, oriented to person, place, and time  Assessment and Recommendations 1.  Family History of DVT.  Although Ms. Pfiester's mother has a history of DVT/PE, Ms. Klouda has not despite several term vaginal deliveries.  In addition, her 2 spontaneous abortions were not over 10 weeks, which can be associated with anti-phospholipid syndrome.  Given this and the fact that Ms. Linhares's is not known to have an inherited thrombophilia I don't think thrombophilia is warranted at this time.  Unless additional problems arise I recommend she receive routine prenatal care.  I spent 20 minutes with Ms. Eppard today of which 50% was face-to-face counseling.  Thank you for referring [facility]. Langlinais to the [facility].  Please do not hesitate to contact us with questions.   Rema Fendt, MD

## 2017-07-18 ENCOUNTER — Inpatient Hospital Stay (HOSPITAL_COMMUNITY)
Admission: AD | Admit: 2017-07-18 | Discharge: 2017-07-18 | Disposition: A | Discharge status: Home / Self Care | Payer: BLUE CROSS/BLUE SHIELD | Source: Ambulatory Visit | Attending: Obstetrics and Gynecology | Admitting: Obstetrics and Gynecology

## 2017-07-18 ENCOUNTER — Encounter (HOSPITAL_COMMUNITY): Payer: Self-pay | Admitting: *Deleted

## 2017-07-18 DIAGNOSIS — F329 Major depressive disorder, single episode, unspecified: Secondary | ICD-10-CM | POA: Diagnosis not present

## 2017-07-18 DIAGNOSIS — Z79899 Other long term (current) drug therapy: Secondary | ICD-10-CM | POA: Diagnosis not present

## 2017-07-18 DIAGNOSIS — O98813 Other maternal infectious and parasitic diseases complicating pregnancy, third trimester: Secondary | ICD-10-CM | POA: Insufficient documentation

## 2017-07-18 DIAGNOSIS — F419 Anxiety disorder, unspecified: Secondary | ICD-10-CM | POA: Diagnosis not present

## 2017-07-18 DIAGNOSIS — Z820 Family history of epilepsy and other diseases of the nervous system: Secondary | ICD-10-CM | POA: Insufficient documentation

## 2017-07-18 DIAGNOSIS — E860 Dehydration: Secondary | ICD-10-CM | POA: Diagnosis not present

## 2017-07-18 DIAGNOSIS — B379 Candidiasis, unspecified: Secondary | ICD-10-CM | POA: Diagnosis not present

## 2017-07-18 DIAGNOSIS — Z809 Family history of malignant neoplasm, unspecified: Secondary | ICD-10-CM | POA: Diagnosis not present

## 2017-07-18 DIAGNOSIS — R109 Unspecified abdominal pain: Secondary | ICD-10-CM | POA: Diagnosis present

## 2017-07-18 DIAGNOSIS — O99343 Other mental disorders complicating pregnancy, third trimester: Secondary | ICD-10-CM | POA: Diagnosis not present

## 2017-07-18 DIAGNOSIS — O26899 Other specified pregnancy related conditions, unspecified trimester: Secondary | ICD-10-CM

## 2017-07-18 DIAGNOSIS — B3731 Acute candidiasis of vulva and vagina: Secondary | ICD-10-CM

## 2017-07-18 DIAGNOSIS — O99283 Endocrine, nutritional and metabolic diseases complicating pregnancy, third trimester: Secondary | ICD-10-CM | POA: Diagnosis not present

## 2017-07-18 DIAGNOSIS — Z885 Allergy status to narcotic agent status: Secondary | ICD-10-CM | POA: Diagnosis not present

## 2017-07-18 DIAGNOSIS — Z9889 Other specified postprocedural states: Secondary | ICD-10-CM | POA: Diagnosis not present

## 2017-07-18 DIAGNOSIS — Z3A33 33 weeks gestation of pregnancy: Secondary | ICD-10-CM | POA: Insufficient documentation

## 2017-07-18 DIAGNOSIS — B373 Candidiasis of vulva and vagina: Secondary | ICD-10-CM

## 2017-07-18 DIAGNOSIS — O99280 Endocrine, nutritional and metabolic diseases complicating pregnancy, unspecified trimester: Secondary | ICD-10-CM

## 2017-07-18 LAB — FETAL FIBRONECTIN: Fetal Fibronectin: NEGATIVE

## 2017-07-18 LAB — URINALYSIS, ROUTINE W REFLEX MICROSCOPIC
Bilirubin Urine: NEGATIVE
Glucose, UA: NEGATIVE mg/dL
Hgb urine dipstick: NEGATIVE
KETONES UR: 5 mg/dL — AB
Nitrite: NEGATIVE
PH: 5 (ref 5.0–8.0)
PROTEIN: NEGATIVE mg/dL
Specific Gravity, Urine: 1.011 (ref 1.005–1.030)

## 2017-07-18 LAB — WET PREP, GENITAL
Clue Cells Wet Prep HPF POC: NONE SEEN
Sperm: NONE SEEN
Trich, Wet Prep: NONE SEEN

## 2017-07-18 MED ORDER — NIFEDIPINE 10 MG PO CAPS
10.0000 mg | ORAL_CAPSULE | Freq: Once | ORAL | Status: AC
  Administered 2017-07-18: 10 mg via ORAL
  Filled 2017-07-18: qty 1

## 2017-07-18 MED ORDER — LACTATED RINGERS IV BOLUS (SEPSIS)
1000.0000 mL | Freq: Once | INTRAVENOUS | Status: AC
  Administered 2017-07-18: 1000 mL via INTRAVENOUS

## 2017-07-18 MED ORDER — TERCONAZOLE 0.4 % VA CREA: 1.0000 | TOPICAL_CREAM | Freq: Every day | VAGINAL | 0 refills | Status: DC

## 2017-07-18 MED ORDER — NIFEDIPINE 10 MG PO CAPS
10.0000 mg | ORAL_CAPSULE | Freq: Once | ORAL | Status: DC
  Filled 2017-07-18: qty 1

## 2017-07-18 NOTE — Discharge Instructions (Signed)
Abdominal Pain During Pregnancy Belly (abdominal) pain is common during pregnancy. Most of the time, it is not a serious problem. Other times, it can be a sign that something is wrong with the pregnancy. Always tell your doctor if you have belly pain. Follow these instructions at home: Monitor your belly pain for any changes. The following actions may help you feel better:  Do not have sex (intercourse) or put anything in your vagina until you feel better.  Rest until your pain stops.  Drink clear fluids if you feel sick to your stomach (nauseous). Do not eat solid food until you feel better.  Only take medicine as told by your doctor.  Keep all doctor visits as told.  Get help right away if:  You are bleeding, leaking fluid, or pieces of tissue come out of your vagina.  You have more pain or cramping.  You keep throwing up (vomiting).  You have pain when you pee (urinate) or have blood in your pee.  You have a fever.  You do not feel your baby moving as much.  You feel very weak or feel like passing out.  You have trouble breathing, with or without belly pain.  You have a very bad headache and belly pain.  You have fluid leaking from your vagina and belly pain.  You keep having watery poop (diarrhea).  Your belly pain does not go away after resting, or the pain gets worse. This information is not intended to replace advice given to you by your health care provider. Make sure you discuss any questions you have with your health care provider. Document Released: 07/25/2009 Document Revised: 03/14/2016 Document Reviewed: 03/05/2013 Elsevier Interactive Patient Education  2018 [employer] Vaginal Yeast infection, Adult Vaginal yeast infection is a condition that causes soreness, swelling, and redness (inflammation) of the vagina. It also causes vaginal discharge. This is a common condition. Some women get this infection frequently. What are the causes? This condition  is caused by a change in the normal balance of the yeast (candida) and bacteria that live in the vagina. This change causes an overgrowth of yeast, which causes the inflammation. What increases the risk? This condition is more likely to develop in:  Women who take antibiotic medicines.  Women who have diabetes.  Women who take birth control pills.  Women who are pregnant.  Women who douche often.  Women who have a weak defense (immune) system.  Women who have been taking steroid medicines for a long time.  Women who frequently wear tight clothing.  What are the signs or symptoms? Symptoms of this condition include:  White, thick vaginal discharge.  Swelling, itching, redness, and irritation of the vagina. The lips of the vagina (vulva) may be affected as well.  Pain or a burning feeling while urinating.  Pain during sex.  How is this diagnosed? This condition is diagnosed with a medical history and physical exam. This will include a pelvic exam. Your health care provider will examine a sample of your vaginal discharge under a microscope. Your health care provider may send this sample for testing to confirm the diagnosis. How is this treated? This condition is treated with medicine. Medicines may be over-the-counter or prescription. You may be told to use one or more of the following:  Medicine that is taken orally.  Medicine that is applied as a cream.  Medicine that is inserted directly into the vagina (suppository).  Follow these instructions at home:  Take or apply  over-the-counter and prescription medicines only as told by your health care provider.  Do not have sex until your health care provider has approved. Tell your sex partner that you have a yeast infection. That person should go to his or her health care provider if he or she develops symptoms.  Do not wear tight clothes, such as pantyhose or tight pants.  Avoid using tampons until your health care  provider approves.  Eat more yogurt. This may help to keep your yeast infection from returning.  Try taking a sitz bath to help with discomfort. This is a warm water bath that is taken while you are sitting down. The water should only come up to your hips and should cover your buttocks. Do this 3-4 times per day or as told by your health care provider.  Do not douche.  Wear breathable, cotton underwear.  If you have diabetes, keep your blood sugar levels under control. Contact a health care provider if:  You have a fever.  Your symptoms go away and then return.  Your symptoms do not get better with treatment.  Your symptoms get worse.  You have new symptoms.  You develop blisters in or around your vagina.  You have blood coming from your vagina and it is not your menstrual period.  You develop pain in your abdomen. This information is not intended to replace advice given to you by your health care provider. Make sure you discuss any questions you have with your health care provider. Document Released: 05/16/2005 Document Revised: 01/18/2016 Document Reviewed: 02/07/2015 Elsevier Interactive Patient Education  2018 [employer]

## 2017-07-18 NOTE — MAU Provider Note (Signed)
History     CSN: [id number]  Arrival date and time: 07/18/17 1934   None     No chief complaint on file.  Heather Lin 36 y.o. [id number] .5929w6d  Presents stating that she has had vaginal pressure and mild contractions since this morning. She denies vaginal bleeding. Pt has not had sex in 2 weeks. She has appointment tomorrow in office.       Past Medical History:  Diagnosis Date  . Allergy   . Anxiety   . Depression   . Migraine     Past Surgical History:  Procedure Laterality Date  . APPENDECTOMY    . LAPAROSCOPIC APPENDECTOMY N/A 11/26/2012   Procedure: APPENDECTOMY LAPAROSCOPIC;  Surgeon: Almond LintFaera Byerly, MD;  Location: [facility] OR;  Service: General;  Laterality: N/A;    Family History  Problem Relation Age of Onset  . Cancer Maternal Grandmother   . Anuerysm Maternal Grandfather   . Alzheimer's disease Paternal Grandmother     Social History   Tobacco Use  . Smoking status: Never Smoker  . Smokeless tobacco: Never Used  Substance Use Topics  . Alcohol use: No    Alcohol/week: 0.0 oz    Comment: occasional  . Drug use: No    Allergies:  Allergies  Allergen Reactions  . Dilaudid [Hydromorphone Hcl] Hives    Can take with benadryl    Medications Prior to Admission  Medication Sig Dispense Refill Last Dose  . ALPRAZolam (XANAX) 1 MG tablet TAKE 1/2 TABLET BY MOUTH TWICE DAILY AS NEEDED FOR ANXIETY (Patient not taking: Reported on 04/16/2017) 30 tablet 0 Not Taking  . Docosahexaenoic Acid (DHA PO) Take 1 capsule by mouth daily.   Taking  . eletriptan (RELPAX) 20 MG tablet Take 1 tablet (20 mg total) by mouth as needed for migraine or headache. One tablet by mouth at onset of headache. May repeat in 2 hours if headache persists or recurs. (Patient not taking: Reported on 04/16/2017) 10 tablet 0 Not Taking  . FLUoxetine (PROZAC) 20 MG tablet Take 1 tablet (20 mg total) by mouth daily. (Patient not taking: Reported on 04/16/2017) 30 tablet 1 Not Taking  . Prenatal  Vit-Fe Fumarate-FA (PRENATAL MULTIVITAMIN) TABS tablet Take 1 tablet by mouth daily at 12 noon.   Taking    Review of Systems  Genitourinary: Positive for pelvic pain.  All other systems reviewed and are negative.  Physical Exam   Blood pressure 119/70, pulse (!) 113, temperature 97.8 F (36.6 C), temperature source Oral, resp. rate 20, height 5\' 3"  (1.6 m), weight 195 lb (88.5 kg), last menstrual period 11/23/2016.  Physical Exam  Nursing note and vitals reviewed. Constitutional: She is oriented to person, place, and time. She appears well-developed and well-nourished.  HENT:  Head: Normocephalic and atraumatic.  Neck: Normal range of motion.  Cardiovascular: Normal rate and regular rhythm.  Respiratory: Effort normal and breath sounds normal. No respiratory distress.  GI: Soft. There is no tenderness.  Genitourinary: Vagina normal.  Musculoskeletal: Normal range of motion.  Neurological: She is alert and oriented to person, place, and time. She has normal reflexes.  Skin: Skin is warm and dry.  Psychiatric: She has a normal mood and affect. Her behavior is normal. Thought content normal.   FHR CAT 1  Uterine Activity q 2-5 and decreasing Results for orders placed or performed during the hospital encounter of 07/18/17 (from the past 24 hour(s))  Urinalysis, Routine w reflex microscopic     Status: Abnormal  Collection Time: 07/18/17  7:46 PM  Result Value Ref Range   Color, Urine YELLOW YELLOW   APPearance CLEAR CLEAR   Specific Gravity, Urine 1.011 1.005 - 1.030   pH 5.0 5.0 - 8.0   Glucose, UA NEGATIVE NEGATIVE mg/dL   Hgb urine dipstick NEGATIVE NEGATIVE   Bilirubin Urine NEGATIVE NEGATIVE   Ketones, ur 5 (A) NEGATIVE mg/dL   Protein, ur NEGATIVE NEGATIVE mg/dL   Nitrite NEGATIVE NEGATIVE   Leukocytes, UA SMALL (A) NEGATIVE   RBC / HPF 0-5 0 - 5 RBC/hpf   WBC, UA 0-5 0 - 5 WBC/hpf   Bacteria, UA RARE (A) NONE SEEN   Squamous Epithelial / LPF 0-5 (A) NONE SEEN    Mucus PRESENT   Fetal fibronectin     Status: None   Collection Time: 07/18/17  8:11 PM  Result Value Ref Range   Fetal Fibronectin NEGATIVE NEGATIVE   MAU Course  Procedures  MDM Labs showed vaginal yeast infection; gc and chlaymdia pending. FFN negative. Ketones in urine so uterine irritability was probable result. Cervix is closed. Will give 2 bags LR and D/C home. 1 dose of procardia was also given. Pt is feeling better and contractions have spaced out. Pt has appointment in the office tomorrow at 830am.   Assessment and Plan  Dehydration Yeast Infection Preterm Contractions  IV fluids  Procardia 10mg  RX for Terazol 7 Follow up in office tomorrow  Elmore GuiseLori A Clemmons CNM 07/18/2017, 8:53 PM

## 2017-07-18 NOTE — MAU Note (Signed)
PT SAYS  SHE FEELS  PRESSURE - STARTED AT 0700.    ALSO HAS  LOWER ABD PAIN - STARTED AT 0700. -  BOTH  HAVE CONTINUED -   BUT  WORSE  NOW.  PNC- WITH  [facility]     CALLED   [facility]  RN  -   TOLD  TO COME IN.    LAST SEX-   2 WEEKS  AGO.

## 2017-07-19 LAB — GC/CHLAMYDIA PROBE AMP (~~LOC~~) NOT AT ARMC
Chlamydia: NEGATIVE
Neisseria Gonorrhea: NEGATIVE

## 2017-07-23 ENCOUNTER — Other Ambulatory Visit: Payer: Self-pay

## 2017-07-23 ENCOUNTER — Encounter (HOSPITAL_COMMUNITY): Payer: Self-pay

## 2017-07-23 ENCOUNTER — Inpatient Hospital Stay (HOSPITAL_COMMUNITY)
Admission: AD | Admit: 2017-07-23 | Discharge: 2017-07-23 | Disposition: A | Discharge status: Home / Self Care | Payer: BLUE CROSS/BLUE SHIELD | Source: Ambulatory Visit | Attending: Obstetrics and Gynecology | Admitting: Obstetrics and Gynecology

## 2017-07-23 DIAGNOSIS — Z3A34 34 weeks gestation of pregnancy: Secondary | ICD-10-CM | POA: Diagnosis not present

## 2017-07-23 DIAGNOSIS — O26893 Other specified pregnancy related conditions, third trimester: Secondary | ICD-10-CM | POA: Insufficient documentation

## 2017-07-23 DIAGNOSIS — R109 Unspecified abdominal pain: Secondary | ICD-10-CM | POA: Diagnosis present

## 2017-07-23 DIAGNOSIS — R102 Pelvic and perineal pain: Secondary | ICD-10-CM | POA: Insufficient documentation

## 2017-07-23 LAB — URINALYSIS, ROUTINE W REFLEX MICROSCOPIC
Bilirubin Urine: NEGATIVE
GLUCOSE, UA: NEGATIVE mg/dL
HGB URINE DIPSTICK: NEGATIVE
Ketones, ur: NEGATIVE mg/dL
NITRITE: NEGATIVE
PH: 6 (ref 5.0–8.0)
Protein, ur: NEGATIVE mg/dL
SPECIFIC GRAVITY, URINE: 1.004 — AB (ref 1.005–1.030)

## 2017-07-23 NOTE — MAU Note (Signed)
Pt presents to MAU with c/o of intermittent lower abdominal pain that started at 1300 today. Pt denies vaginal bleeding and LOF. +FM

## 2017-07-23 NOTE — MAU Note (Signed)
Chief Complaint:  Abdominal Pain   First Provider Initiated Contact with Patient 07/23/17 1944     HPI: Heather Lin is a 36 y.o. [id number] at 8336w4d who presents to maternity admissions reporting pelvic pressure.  Not like contractions.  Was just seen on 11/29 and treated for a yeast infection.  Walked a lot today.  Had intercourse last night.  Denies urinary frequency or discharge.  Denies bleeding or leaking fluid.  FM +..  Location: symphysis pubis Quality: Cramping Severity: 3/10 in pain scale Duration: since 1300 Context: Comes and goes  Modifying factors: Has not done anything to relieve it   Pregnancy Course:   Past Medical History:  Diagnosis Date  . Allergy   . Anxiety   . Depression   . Migraine    OB History  Gravida Para Term Preterm AB Living  6 3 2 1 2 3   SAB TAB Ectopic Multiple Live Births  2       3    # Outcome Date GA Lbr Len/2nd Weight Sex Delivery Anes PTL Lv  6 Current           5 SAB           4 Term      Vag-Spont     3 SAB           2 Term      Vag-Spont     1 Preterm  5762w0d    Vag-Spont   LIV     Past Surgical History:  Procedure Laterality Date  . APPENDECTOMY    . LAPAROSCOPIC APPENDECTOMY N/A 11/26/2012   Procedure: APPENDECTOMY LAPAROSCOPIC;  Surgeon: Almond LintFaera Byerly, MD;  Location: [facility] OR;  Service: General;  Laterality: N/A;   Family History  Problem Relation Age of Onset  . Cancer Maternal Grandmother   . Anuerysm Maternal Grandfather   . Alzheimer's disease Paternal Grandmother    Social History   Tobacco Use  . Smoking status: Never Smoker  . Smokeless tobacco: Never Used  Substance Use Topics  . Alcohol use: No    Alcohol/week: 0.0 oz    Comment: occasional  . Drug use: No   Allergies  Allergen Reactions  . Dilaudid [Hydromorphone Hcl] Hives    Can take with benadryl   Medications Prior to Admission  Medication Sig Dispense Refill Last Dose  . acetaminophen (TYLENOL) 500 MG tablet Take 1,000 mg by mouth every 6  (six) hours as needed for moderate pain.   Past Week at Unknown time  . aspirin EC 81 MG tablet Take 81 mg by mouth daily.   07/22/2017 at Unknown time  . IRON PO Take 1 tablet by mouth daily.   07/22/2017 at Unknown time  . Prenatal Vit-Fe Fumarate-FA (PRENATAL MULTIVITAMIN) TABS tablet Take 1 tablet by mouth daily at 12 noon.   07/22/2017 at Unknown time  . terconazole (TERAZOL 7) 0.4 % vaginal cream Place 1 applicator vaginally at bedtime. 45 g 0 Past Week at Unknown time  . ALPRAZolam (XANAX) 1 MG tablet TAKE 1/2 TABLET BY MOUTH TWICE DAILY AS NEEDED FOR ANXIETY (Patient not taking: Reported on 04/16/2017) 30 tablet 0 Not Taking    I have reviewed patient's Past Medical Hx, Surgical Hx, Family Hx, Social Hx, medications and allergies.   ROS:  Review of Systems  Constitutional: Negative.   HENT: Negative.   Eyes: Negative.   Respiratory: Negative.   Cardiovascular: Negative.   Gastrointestinal: Negative.   Endocrine: Negative.   Genitourinary:  Positive for pelvic pain.  Musculoskeletal: Negative.   Allergic/Immunologic: Negative.   Neurological: Negative.   Hematological: Negative.   Psychiatric/Behavioral: Negative.     Physical Exam   Patient Vitals for the past 24 hrs:  BP Temp Temp src Pulse Resp  07/23/17 1847 125/75 97.6 F (36.4 C) Oral (!) 113 18   Constitutional: Well-developed, well-nourished female in no acute distress.  Cardiovascular: normal rate Respiratory: normal effort GI: Abd soft, non-tender, gravid appropriate for gestational age. Pos BS x 4 MS: Extremities nontender, no edema, normal ROM Neurologic: Alert and oriented x 4.  GU: Neg CVAT.   FHT:  Baseline 140, moderate variability, accelerations present, no decelerations Contractions: irreg   Labs: Results for orders placed or performed during the hospital encounter of 07/23/17 (from the past 24 hour(s))  Urinalysis, Routine w reflex microscopic     Status: Abnormal   Collection Time: 07/23/17  6:37  PM  Result Value Ref Range   Color, Urine YELLOW YELLOW   APPearance HAZY (A) CLEAR   Specific Gravity, Urine 1.004 (L) 1.005 - 1.030   pH 6.0 5.0 - 8.0   Glucose, UA NEGATIVE NEGATIVE mg/dL   Hgb urine dipstick NEGATIVE NEGATIVE   Bilirubin Urine NEGATIVE NEGATIVE   Ketones, ur NEGATIVE NEGATIVE mg/dL   Protein, ur NEGATIVE NEGATIVE mg/dL   Nitrite NEGATIVE NEGATIVE   Leukocytes, UA MODERATE (A) NEGATIVE   RBC / HPF 0-5 0 - 5 RBC/hpf   WBC, UA 6-30 0 - 5 WBC/hpf   Bacteria, UA MANY (A) NONE SEEN   Squamous Epithelial / LPF 6-30 (A) NONE SEEN   Mucus PRESENT    Sperm, UA PRESENT     Imaging:  No results found.  MAU Course: Orders Placed This Encounter  Procedures  . Urinalysis, Routine w reflex microscopic   No orders of the defined types were placed in this encounter.   MDM: PE, UA Assessment: Pelvic pain in pregnancy Plan: Discussed comfort measures of mini cradle.  Common discomforts of pregnancy.  Pt wants modified work schedule and sent paperwork to office.  Will give note for tomorrow.  Discussed can use Tylenol, warm bath or heat. Discharge home in stable condition.   Labor precautions and fetal kick counts     Kenney Housemanrothero, Nancy Jean, CNM 07/23/2017 7:57 PM

## 2017-08-09 LAB — OB RESULTS CONSOLE GBS
GBS: POSITIVE
GBS: POSITIVE
GBS: POSITIVE

## 2017-08-20 NOTE — L&D Delivery Note (Signed)
Delivery Note  At 3:55 PM a viable female was delivered via Vaginal  APGAR:9,9.   Placenta status:spontaneous, complete with 3 vessel Cord: 1 loose nuchal cord reduced over anterior shoulder  Anesthesia:  none Episiotomy: None Lacerations:  none  Est. Blood Loss (mL):  300 Patient given 10 u IM Pitocin and Cytotec 1000 mg per rectum  Mom to postpartum.  Baby to Couplet care / Skin to Skin.  Dois DavenportSandra A Lalaine Overstreet 08/31/2017, 4:17 PM

## 2017-08-31 ENCOUNTER — Inpatient Hospital Stay (HOSPITAL_COMMUNITY): Payer: BLUE CROSS/BLUE SHIELD

## 2017-08-31 ENCOUNTER — Other Ambulatory Visit: Payer: Self-pay

## 2017-08-31 ENCOUNTER — Encounter (HOSPITAL_COMMUNITY): Payer: Self-pay | Admitting: *Deleted

## 2017-08-31 ENCOUNTER — Inpatient Hospital Stay (HOSPITAL_COMMUNITY)
Admission: AD | Admit: 2017-08-31 | Discharge: 2017-09-02 | DRG: 807 | Disposition: A | Discharge status: Home / Self Care | Payer: BLUE CROSS/BLUE SHIELD | Source: Ambulatory Visit | Attending: Obstetrics and Gynecology | Admitting: Obstetrics and Gynecology

## 2017-08-31 DIAGNOSIS — Z3A40 40 weeks gestation of pregnancy: Secondary | ICD-10-CM | POA: Diagnosis not present

## 2017-08-31 DIAGNOSIS — O99824 Streptococcus B carrier state complicating childbirth: Secondary | ICD-10-CM | POA: Diagnosis present

## 2017-08-31 DIAGNOSIS — Z3483 Encounter for supervision of other normal pregnancy, third trimester: Secondary | ICD-10-CM | POA: Diagnosis present

## 2017-08-31 DIAGNOSIS — O99344 Other mental disorders complicating childbirth: Secondary | ICD-10-CM | POA: Diagnosis present

## 2017-08-31 DIAGNOSIS — F329 Major depressive disorder, single episode, unspecified: Secondary | ICD-10-CM | POA: Diagnosis present

## 2017-08-31 DIAGNOSIS — F419 Anxiety disorder, unspecified: Secondary | ICD-10-CM | POA: Diagnosis present

## 2017-08-31 DIAGNOSIS — Z3490 Encounter for supervision of normal pregnancy, unspecified, unspecified trimester: Secondary | ICD-10-CM

## 2017-08-31 DIAGNOSIS — F32A Depression, unspecified: Secondary | ICD-10-CM | POA: Diagnosis not present

## 2017-08-31 LAB — CBC
HCT: 38.7 % (ref 36.0–46.0)
Hemoglobin: 13.1 g/dL (ref 12.0–15.0)
MCH: 29.6 pg (ref 26.0–34.0)
MCHC: 33.9 g/dL (ref 30.0–36.0)
MCV: 87.6 fL (ref 78.0–100.0)
PLATELETS: 269 10*3/uL (ref 150–400)
RBC: 4.42 MIL/uL (ref 3.87–5.11)
RDW: 13.1 % (ref 11.5–15.5)
WBC: 9.8 10*3/uL (ref 4.0–10.5)

## 2017-08-31 LAB — COMPREHENSIVE METABOLIC PANEL
ALT: 14 U/L (ref 14–54)
AST: 18 U/L (ref 15–41)
Albumin: 3 g/dL — ABNORMAL LOW (ref 3.5–5.0)
Alkaline Phosphatase: 132 U/L — ABNORMAL HIGH (ref 38–126)
Anion gap: 11 (ref 5–15)
BUN: 7 mg/dL (ref 6–20)
CHLORIDE: 104 mmol/L (ref 101–111)
CO2: 22 mmol/L (ref 22–32)
CREATININE: 0.72 mg/dL (ref 0.44–1.00)
Calcium: 9.5 mg/dL (ref 8.9–10.3)
GFR calc non Af Amer: >60 mL/min (ref >60)
Glucose, Bld: 134 mg/dL — ABNORMAL HIGH (ref 65–99)
Potassium: 3.7 mmol/L (ref 3.5–5.1)
SODIUM: 137 mmol/L (ref 135–145)
Total Bilirubin: 0.7 mg/dL (ref 0.3–1.2)
Total Protein: 6.8 g/dL (ref 6.5–8.1)

## 2017-08-31 LAB — URIC ACID: Uric Acid, Serum: 5.2 mg/dL (ref 2.3–6.6)

## 2017-08-31 LAB — PROTEIN / CREATININE RATIO, URINE
CREATININE, URINE: 344 mg/dL
Protein Creatinine Ratio: 0.05 mg/mg{Cre} (ref 0.00–0.15)
TOTAL PROTEIN, URINE: 16 mg/dL

## 2017-08-31 LAB — TYPE AND SCREEN
ABO/RH(D): O POS
ANTIBODY SCREEN: NEGATIVE

## 2017-08-31 LAB — OB RESULTS CONSOLE GBS: GBS: NEGATIVE

## 2017-08-31 LAB — POCT FERN TEST: POCT Fern Test: POSITIVE

## 2017-08-31 LAB — ABO/RH: ABO/RH(D): O POS

## 2017-08-31 MED ORDER — COCONUT OIL OIL
1.0000 "application " | TOPICAL_OIL | Status: DC | PRN
  Administered 2017-09-01: 1 via TOPICAL
  Filled 2017-08-31: qty 120

## 2017-08-31 MED ORDER — TETANUS-DIPHTH-ACELL PERTUSSIS 5-2.5-18.5 LF-MCG/0.5 IM SUSP: 0.5000 mL | Freq: Once | INTRAMUSCULAR | Status: DC

## 2017-08-31 MED ORDER — IBUPROFEN 600 MG PO TABS
600.0000 mg | ORAL_TABLET | Freq: Four times a day (QID) | ORAL | Status: DC
  Administered 2017-08-31 – 2017-09-02 (×8): 600 mg via ORAL
  Filled 2017-08-31 (×8): qty 1

## 2017-08-31 MED ORDER — SIMETHICONE 80 MG PO CHEW: 80.0000 mg | CHEWABLE_TABLET | ORAL | Status: DC | PRN

## 2017-08-31 MED ORDER — ONDANSETRON HCL 4 MG/2ML IJ SOLN: 4.0000 mg | INTRAMUSCULAR | Status: DC | PRN

## 2017-08-31 MED ORDER — LACTATED RINGERS IV SOLN
INTRAVENOUS | Status: DC
  Administered 2017-08-31: 13:00:00 via INTRAVENOUS

## 2017-08-31 MED ORDER — ONDANSETRON HCL 4 MG/2ML IJ SOLN: 4.0000 mg | Freq: Four times a day (QID) | INTRAMUSCULAR | Status: DC | PRN

## 2017-08-31 MED ORDER — BENZOCAINE-MENTHOL 20-0.5 % EX AERO
1.0000 "application " | INHALATION_SPRAY | CUTANEOUS | Status: DC | PRN
  Filled 2017-08-31: qty 56

## 2017-08-31 MED ORDER — TERBUTALINE SULFATE 1 MG/ML IJ SOLN
0.2500 mg | Freq: Once | INTRAMUSCULAR | Status: DC | PRN
  Filled 2017-08-31: qty 1

## 2017-08-31 MED ORDER — MEASLES, MUMPS & RUBELLA VAC ~~LOC~~ INJ
0.5000 mL | INJECTION | Freq: Once | SUBCUTANEOUS | Status: DC
  Filled 2017-08-31: qty 0.5

## 2017-08-31 MED ORDER — LACTATED RINGERS IV SOLN: 500.0000 mL | INTRAVENOUS | Status: DC | PRN

## 2017-08-31 MED ORDER — FLEET ENEMA 7-19 GM/118ML RE ENEM: 1.0000 | ENEMA | RECTAL | Status: DC | PRN

## 2017-08-31 MED ORDER — OXYCODONE-ACETAMINOPHEN 5-325 MG PO TABS: 1.0000 | ORAL_TABLET | ORAL | Status: DC | PRN

## 2017-08-31 MED ORDER — PRENATAL MULTIVITAMIN CH
1.0000 | ORAL_TABLET | Freq: Every day | ORAL | Status: DC
  Administered 2017-09-01 – 2017-09-02 (×2): 1 via ORAL
  Filled 2017-08-31 (×2): qty 1

## 2017-08-31 MED ORDER — OXYTOCIN 10 UNIT/ML IJ SOLN
INTRAMUSCULAR | Status: AC
  Administered 2017-08-31: 10 [IU] via INTRAMUSCULAR
  Filled 2017-08-31: qty 1

## 2017-08-31 MED ORDER — FENTANYL CITRATE (PF) 100 MCG/2ML IJ SOLN
100.0000 ug | Freq: Once | INTRAMUSCULAR | Status: AC
  Administered 2017-08-31: 100 ug via INTRAVENOUS
  Filled 2017-08-31: qty 2

## 2017-08-31 MED ORDER — OXYTOCIN 10 UNIT/ML IJ SOLN
10.0000 [IU] | Freq: Once | INTRAMUSCULAR | Status: AC
  Administered 2017-08-31: 10 [IU] via INTRAMUSCULAR

## 2017-08-31 MED ORDER — DIBUCAINE 1 % RE OINT: 1.0000 "application " | TOPICAL_OINTMENT | RECTAL | Status: DC | PRN

## 2017-08-31 MED ORDER — OXYTOCIN 40 UNITS IN LACTATED RINGERS INFUSION - SIMPLE MED
2.5000 [IU]/h | INTRAVENOUS | Status: DC
  Filled 2017-08-31: qty 1000

## 2017-08-31 MED ORDER — OXYTOCIN 40 UNITS IN LACTATED RINGERS INFUSION - SIMPLE MED: 1.0000 m[IU]/min | INTRAVENOUS | Status: DC

## 2017-08-31 MED ORDER — ZOLPIDEM TARTRATE 5 MG PO TABS: 5.0000 mg | ORAL_TABLET | Freq: Every evening | ORAL | Status: DC | PRN

## 2017-08-31 MED ORDER — MISOPROSTOL 200 MCG PO TABS
ORAL_TABLET | ORAL | Status: AC
  Administered 2017-08-31: 1000 ug via RECTAL
  Filled 2017-08-31: qty 5

## 2017-08-31 MED ORDER — WITCH HAZEL-GLYCERIN EX PADS: 1.0000 "application " | MEDICATED_PAD | CUTANEOUS | Status: DC | PRN

## 2017-08-31 MED ORDER — ACETAMINOPHEN 325 MG PO TABS
650.0000 mg | ORAL_TABLET | ORAL | Status: DC | PRN
  Administered 2017-08-31 – 2017-09-02 (×4): 650 mg via ORAL
  Filled 2017-08-31 (×4): qty 2

## 2017-08-31 MED ORDER — ONDANSETRON HCL 4 MG PO TABS: 4.0000 mg | ORAL_TABLET | ORAL | Status: DC | PRN

## 2017-08-31 MED ORDER — PENICILLIN G POT IN DEXTROSE 60000 UNIT/ML IV SOLN
3.0000 10*6.[IU] | INTRAVENOUS | Status: DC
  Filled 2017-08-31 (×3): qty 50

## 2017-08-31 MED ORDER — SENNOSIDES-DOCUSATE SODIUM 8.6-50 MG PO TABS
2.0000 | ORAL_TABLET | ORAL | Status: DC
  Administered 2017-08-31 – 2017-09-01 (×2): 2 via ORAL
  Filled 2017-08-31 (×2): qty 2

## 2017-08-31 MED ORDER — OXYTOCIN BOLUS FROM INFUSION: 500.0000 mL | Freq: Once | INTRAVENOUS | Status: DC

## 2017-08-31 MED ORDER — LIDOCAINE HCL (PF) 1 % IJ SOLN
30.0000 mL | INTRAMUSCULAR | Status: DC | PRN
  Filled 2017-08-31: qty 30

## 2017-08-31 MED ORDER — PENICILLIN G POTASSIUM 5000000 UNITS IJ SOLR
5.0000 10*6.[IU] | Freq: Once | INTRAVENOUS | Status: AC
  Administered 2017-08-31: 5 10*6.[IU] via INTRAVENOUS
  Filled 2017-08-31: qty 5

## 2017-08-31 MED ORDER — ACETAMINOPHEN 325 MG PO TABS
650.0000 mg | ORAL_TABLET | ORAL | Status: DC | PRN
  Administered 2017-08-31: 650 mg via ORAL
  Filled 2017-08-31: qty 2

## 2017-08-31 MED ORDER — MISOPROSTOL 200 MCG PO TABS
1000.0000 ug | ORAL_TABLET | Freq: Once | ORAL | Status: AC
  Administered 2017-08-31: 1000 ug via RECTAL

## 2017-08-31 MED ORDER — DIPHENHYDRAMINE HCL 25 MG PO CAPS: 25.0000 mg | ORAL_CAPSULE | Freq: Four times a day (QID) | ORAL | Status: DC | PRN

## 2017-08-31 MED ORDER — OXYCODONE-ACETAMINOPHEN 5-325 MG PO TABS: 2.0000 | ORAL_TABLET | ORAL | Status: DC | PRN

## 2017-08-31 MED ORDER — FERROUS SULFATE 325 (65 FE) MG PO TABS
325.0000 mg | ORAL_TABLET | Freq: Two times a day (BID) | ORAL | Status: DC
  Administered 2017-09-01 – 2017-09-02 (×3): 325 mg via ORAL
  Filled 2017-08-31 (×3): qty 1

## 2017-08-31 MED ORDER — SOD CITRATE-CITRIC ACID 500-334 MG/5ML PO SOLN: 30.0000 mL | ORAL | Status: DC | PRN

## 2017-08-31 NOTE — MAU Note (Signed)
Patient presents with contractions since yesterday at 12, gotten stronger, had bloody show this morning.

## 2017-08-31 NOTE — Anesthesia Pain Management Evaluation Note (Signed)
  CRNA Pain Management Visit Note  Patient: Heather Lin, 37 y.o., female  "Hello [place] member of the anesthesia team at [facility]. We have an anesthesia team available at all times to provide care throughout the hospital, including epidural management and anesthesia for C-section. I don't know your plan for the delivery whether it a natural birth, water birth, IV sedation, nitrous supplementation, doula or epidural, but we want to meet your pain goals."   1.Was your pain managed to your expectations on prior hospitalizations?   Yes   2.What is your expectation for pain management during this hospitalization?     Labor support without medications  3.How can we help you reach that goal? natural  Record the patient's initial score and the patient's pain goal.   Pain: 10  Pain Goal: 10 [facility] wants you to be able to say your pain was always managed very well.  Sherry Blackard 08/31/2017

## 2017-08-31 NOTE — H&P (Signed)
Heather Lin is a 37 y.o. female, G6P3 at 40+1 weeks presenting for contractions. Initially was without regular contractions after arrival at MAU but had elevated BP without Sx of pre-eclampsia. PIH workup was negative. NST was reassuring but 2 variable decelerations were noted. BPP was 8/8. Patient was offered admission with IOL and accepted. While awaiting for a L&D bed, contractions resumed and patient had SROM at 14:05 with clear AFI.  Pregnancy followed at [facility] since 6+3  weeks and remarkable for:  1. AMA: genetic testing declined 2. GBS + 3. Previous h/o pre-eclampsia now on ASA 81 mg daily  OB History    Gravida Para Term Preterm AB Living   6 3 2 1 2 3    SAB TAB Ectopic Multiple Live Births   2       3     Past Medical History:  Diagnosis Date  . Allergy   . Anxiety   . Depression   . Migraine    Past Surgical History:  Procedure Laterality Date  . APPENDECTOMY    . LAPAROSCOPIC APPENDECTOMY N/A 11/26/2012   Procedure: APPENDECTOMY LAPAROSCOPIC;  Surgeon: Almond LintFaera Byerly, MD;  Location: [facility] OR;  Service: General;  Laterality: N/A;    Family History:   family history includes Alzheimer's disease in her paternal grandmother; Anuerysm in her maternal grandfather; Cancer in her maternal grandmother. Social History:    reports that  has never smoked. she has never used smokeless tobacco. She reports that she does not drink alcohol or use drugs.   Prenatal labs: ABO, Rh: --/--/O POS (01/12 1308) Antibody: NEG (01/12 1308) Rubella:  immune RPR:   negative HBsAg:   negative HIV:   negative GBS:   positive EFW 08/14/17 at 7 lbs 1 oz with normal AFI and BPP 8/8   Prenatal Transfer Tool  Maternal Diabetes: No Genetic Screening: Declined Maternal Ultrasounds/Referrals: Normal Fetal Ultrasounds or other Referrals:  None Maternal Substance Abuse:  No Significant Maternal Medications:  None Significant Maternal Lab Results: None   Dilation: 7.5 Effacement (%):  100 Station: -1 Exam by:: Mary Swazil[place]Jordan Johnson, RN  Blood pressure 126/85, pulse (!) 105, temperature 98.1 F (36.7 C), resp. rate 18, height 5\' 3"  (1.6 m), weight 88 kg (194 lb), last menstrual period 11/23/2016, SpO2 100 %.  General Appearance: Alert, appropriate appearance for age. No acute distress HEENT Exam: Grossly normal Chest/Respiratory Exam: Normal chest wall and respirations. Clear to auscultation Cardiovascular Exam: Regular rate and rhythm. S1, S2, no murmur Gastrointestinal Exam: soft, non-tender, Uterus gravid with size compatible with GA, Vertex presentation by Leopold's maneuvers Psychiatric Exam: Alert and oriented, appropriate affect  ++++++++++++++++++++++++++++++++++++++++++++++++++++++++++++++++  Vaginal exam: ant lip -1  Fetal tracings: reassuring with occasional variable decelerations  ++++++++++++++++++++++++++++++++++++++++++++++++++++++++++++++++   Assessment/Plan:  GBS prophylaxis ongoing SVD expected   Silverio LaySandra Amaka Gluth MD 08/31/2017, 3:20 PM

## 2017-08-31 NOTE — Progress Notes (Signed)
Report called to Belenda CruiseHeather Mitchell, Charge RN on BS.  Will call us back with bed when available.

## 2017-08-31 NOTE — Progress Notes (Addendum)
Assumed care from primary nurse. Turned tp to right lateral. Monitors adjusted.   1403: decel noted. In room and pt standing at bs. States water broke. Sample taken for ferning. Small wet spot noted on pad.   Placed pt back in bed. tranducer adjusted.

## 2017-09-01 LAB — CBC
HCT: 30.6 % — ABNORMAL LOW (ref 36.0–46.0)
HEMOGLOBIN: 10.6 g/dL — AB (ref 12.0–15.0)
MCH: 30.1 pg (ref 26.0–34.0)
MCHC: 34.6 g/dL (ref 30.0–36.0)
MCV: 86.9 fL (ref 78.0–100.0)
Platelets: 228 10*3/uL (ref 150–400)
RBC: 3.52 MIL/uL — AB (ref 3.87–5.11)
RDW: 13.2 % (ref 11.5–15.5)
WBC: 14 10*3/uL — AB (ref 4.0–10.5)

## 2017-09-01 LAB — RPR: RPR: NONREACTIVE

## 2017-09-01 NOTE — Progress Notes (Signed)
Post Partum Day 1 Subjective:  Well. Lochia are normal. Voiding, ambulating, tolerating normal diet. nursing going well.  Objective: Blood pressure 118/63, pulse (!) 108, temperature 97.8 F (36.6 C), temperature source Oral, resp. rate 18, height 5\' 3"  (1.6 m), weight 194 lb (88 kg), last menstrual period 11/23/2016, SpO2 99 %, unknown if currently breastfeeding.  Physical Exam:  General: normal Lochia: appropriate Uterine Fundus: @ U firm non-tender  Extremities: No evidence of DVT seen on physical exam.     Recent Labs    08/31/17 1026 09/01/17 0521  HGB 13.1 10.6*  HCT 38.7 30.6*    Assessment/Plan: Normal Post-partum. Continue routine post-partum care. GBS pos Anticipate discharge tomorrow    LOS: 1 day   Elmore GuiseLori A Clemmons CNM 09/01/2017, 7:29 AM

## 2017-09-01 NOTE — Lactation Note (Signed)
This note was copied from a baby's chart. Lactation Consultation Note  Patient Name: Heather Lin [id number]'s Date: 09/01/2017  As LC entered the room , baby latched with depth, swallows noted.  Per mom baby was circ'd this am and did not feed until this afternoon.  Baby re-latched while the LC in the room , mom independent. LC recommended trying the cross cradle 1st and the  switching arms to cradle.  Mother informed of post-discharge support and given phone number to the lactation department, including services for phone call assistance; out-patient appointments; and breastfeeding support group. List of other breastfeeding resources in the community given in the handout. Encouraged mother to call for problems or concerns related to breastfeeding.  Mom plans to call insurance company for DEBP . Will need a hand pump for D/C.   Maternal Data Has patient been taught Hand Expression?: Yes Does the patient have breastfeeding experience prior to this delivery?: Yes  Feeding Feeding Type: Breast Fed Length of feed: 30 min(LC noted swallows )  LATCH Score Latch: (latched with swallows )  Audible Swallowing: (swallows noted )  Type of Nipple: Everted at rest and after stimulation  Comfort (Breast/Nipple): (per mom comfortable )  Hold (Positioning): (mom independent )  LATCH Score: 9  Interventions Interventions: Breast feeding basics reviewed  Lactation Tools Discussed/Used WIC Program: No   Consult Status Consult Status: Follow-up Date: 09/01/17 Follow-up type: In-patient    Heather Lin 09/01/2017, 4:34 PM

## 2017-09-02 DIAGNOSIS — F419 Anxiety disorder, unspecified: Secondary | ICD-10-CM | POA: Diagnosis not present

## 2017-09-02 DIAGNOSIS — F329 Major depressive disorder, single episode, unspecified: Secondary | ICD-10-CM | POA: Diagnosis not present

## 2017-09-02 DIAGNOSIS — F32A Depression, unspecified: Secondary | ICD-10-CM | POA: Diagnosis not present

## 2017-09-02 MED ORDER — HYDROXYZINE PAMOATE 50 MG PO CAPS: 50.0000 mg | ORAL_CAPSULE | Freq: Three times a day (TID) | ORAL | 2 refills | Status: DC | PRN

## 2017-09-02 MED ORDER — OXYCODONE HCL 5 MG PO TABS: 5.0000 mg | ORAL_TABLET | ORAL | 0 refills | Status: DC | PRN

## 2017-09-02 MED ORDER — SERTRALINE HCL 50 MG PO TABS: 50.0000 mg | ORAL_TABLET | Freq: Every day | ORAL | 11 refills | Status: DC

## 2017-09-02 MED ORDER — NORETHINDRONE 0.35 MG PO TABS: 1.0000 | ORAL_TABLET | Freq: Every day | ORAL | 11 refills | Status: DC

## 2017-09-02 MED ORDER — IBUPROFEN 600 MG PO TABS: 600.0000 mg | ORAL_TABLET | Freq: Four times a day (QID) | ORAL | 0 refills | Status: DC

## 2017-09-02 NOTE — Discharge Instructions (Signed)
Postpartum Depression and Baby Blues  The postpartum period begins right after the birth of a baby. During this time, there is often a great amount of joy and excitement. It is also a time of many changes in the life of the parents. Regardless of how many times a mother gives birth, each child brings new challenges and dynamics to the family. It is not unusual to have feelings of excitement along with confusing shifts in moods, emotions, and thoughts. All mothers are at risk of developing postpartum depression or the "baby blues." These mood changes can occur right after giving birth, or they may occur many months after giving birth. The baby blues or postpartum depression can be mild or severe. Additionally, postpartum depression can go away rather quickly, or it can be a long-term condition.  What are the causes?  Raised hormone levels and the rapid drop in those levels are thought to be a main cause of postpartum depression and the baby blues. A number of hormones change during and after pregnancy. Estrogen and progesterone usually decrease right after the delivery of your baby. The levels of thyroid hormone and various cortisol steroids also rapidly drop. Other factors that play a role in these mood changes include major life events and genetics.  What increases the risk?  If you have any of the following risks for the baby blues or postpartum depression, know what symptoms to watch out for during the postpartum period. Risk factors that may increase the likelihood of getting the baby blues or postpartum depression include:   Having a personal or family history of depression.   Having depression while being pregnant.   Having premenstrual mood issues or mood issues related to oral contraceptives.   Having a lot of life stress.   Having marital conflict.   Lacking a social support network.   Having a baby with special needs.   Having health problems, such as diabetes.    What are the signs or  symptoms?  Symptoms of baby blues include:   Brief changes in mood, such as going from extreme happiness to sadness.   Decreased concentration.   Difficulty sleeping.   Crying spells, tearfulness.   Irritability.   Anxiety.    Symptoms of postpartum depression typically begin within the first month after giving birth. These symptoms include:   Difficulty sleeping or excessive sleepiness.   Marked weight loss.   Agitation.   Feelings of worthlessness.   Lack of interest in activity or food.    Postpartum psychosis is a very serious condition and can be dangerous. Fortunately, it is rare. Displaying any of the following symptoms is cause for immediate medical attention. Symptoms of postpartum psychosis include:   Hallucinations and delusions.   Bizarre or disorganized behavior.   Confusion or disorientation.    How is this diagnosed?  A diagnosis is made by an evaluation of your symptoms. There are no medical or lab tests that lead to a diagnosis, but there are various questionnaires that a health care provider may use to identify those with the baby blues, postpartum depression, or psychosis. Often, a screening tool called the Edinburgh Postnatal Depression Scale is used to diagnose depression in the postpartum period.  How is this treated?  The baby blues usually goes away on its own in 1-2 weeks. Social support is often all that is needed. You will be encouraged to get adequate sleep and rest. Occasionally, you may be given medicines to help you sleep.  Postpartum depression   requires treatment because it can last several months or longer if it is not treated. Treatment may include individual or group therapy, medicine, or both to address any social, physiological, and psychological factors that may play a role in the depression. Regular exercise, a healthy diet, rest, and social support may also be strongly recommended.  Postpartum psychosis is more serious and needs treatment right away.  Hospitalization is often needed.  Follow these instructions at home:   Get as much rest as you can. Nap when the baby sleeps.   Exercise regularly. Some women find yoga and walking to be beneficial.   Eat a balanced and nourishing diet.   Do little things that you enjoy. Have a cup of tea, take a bubble bath, read your favorite magazine, or listen to your favorite music.   Avoid alcohol.   Ask for help with household chores, cooking, grocery shopping, or running errands as needed. Do not try to do everything.   Talk to people close to you about how you are feeling. Get support from your partner, family members, friends, or other new moms.   Try to stay positive in how you think. Think about the things you are grateful for.   Do not spend a lot of time alone.   Only take over-the-counter or prescription medicine as directed by your health care provider.   Keep all your postpartum appointments.   Let your health care provider know if you have any concerns.  Contact a health care provider if:  You are having a reaction to or problems with your medicine.  Get help right away if:   You have suicidal feelings.   You think you may harm the baby or someone else.  This information is not intended to replace advice given to you by your health care provider. Make sure you discuss any questions you have with your health care provider.  Document Released: 05/10/2004 Document Revised: 01/12/2016 Document Reviewed: 05/18/2013  Elsevier Interactive Patient Education  2017 [employer]  Postpartum Care After Vaginal Delivery  The period of time right after you deliver your newborn is called the postpartum period.  What kind of medical care will I receive?   You may continue to receive fluids and medicines through an IV tube inserted into one of your veins.   If an incision was made near your vagina (episiotomy) or if you had some vaginal tearing during delivery, cold compresses may be placed on your episiotomy or  your tear. This helps to reduce pain and swelling.   You may be given a squirt bottle to use when you go to the bathroom. You may use this until you are comfortable wiping as usual. To use the squirt bottle, follow these steps:  ? Before you urinate, fill the squirt bottle with warm water. Do not use hot water.  ? After you urinate, while you are sitting on the toilet, use the squirt bottle to rinse the area around your urethra and vaginal opening. This rinses away any urine and blood.  ? You may do this instead of wiping. As you start healing, you may use the squirt bottle before wiping yourself. Make sure to wipe gently.  ? Fill the squirt bottle with clean water every time you use the bathroom.   You will be given sanitary pads to wear.  How can I expect to feel?   You may not feel the need to urinate for several hours after delivery.   You will have   some soreness and pain in your abdomen and vagina.   If you are breastfeeding, you may have uterine contractions every time you breastfeed for up to several weeks postpartum. Uterine contractions help your uterus return to its normal size.   It is normal to have vaginal bleeding (lochia) after delivery. The amount and appearance of lochia is often similar to a menstrual period in the first week after delivery. It will gradually decrease over the next few weeks to a dry, yellow-brown discharge. For most women, lochia stops completely by 6-8 weeks after delivery. Vaginal bleeding can vary from woman to woman.   Within the first few days after delivery, you may have breast engorgement. This is when your breasts feel heavy, full, and uncomfortable. Your breasts may also throb and feel hard, tightly stretched, warm, and tender. After this occurs, you may have milk leaking from your breasts.Your health care provider can help you relieve discomfort due to breast engorgement. Breast engorgement should go away within a few days.   You may feel more sad or worried  than normal due to hormonal changes after delivery. These feelings should not last more than a few days. If these feelings do not go away after several days, speak with your health care provider.  How should I care for myself?   Tell your health care provider if you have pain or discomfort.   Drink enough water to keep your urine clear or pale yellow.   Wash your hands thoroughly with soap and water for at least 20 seconds after changing your sanitary pads, after using the toilet, and before holding or feeding your baby.   If you are not breastfeeding, avoid touching your breasts a lot. Doing this can make your breasts produce more milk.   If you become weak or lightheaded, or you feel like you might faint, ask for help before:  ? Getting out of bed.  ? Showering.   Change your sanitary pads frequently. Watch for any changes in your flow, such as a sudden increase in volume, a change in color, the passing of large blood clots. If you pass a blood clot from your vagina, save it to show to your health care provider. Do not flush blood clots down the toilet without having your health care provider look at them.   Make sure that all your vaccinations are up to date. This can help protect you and your baby from getting certain diseases. You may need to have immunizations done before you leave the hospital.   If desired, talk with your health care provider about methods of family planning or birth control (contraception).  How can I start bonding with my baby?  Spending as much time as possible with your baby is very important. During this time, you and your baby can get to know each other and develop a bond. Having your baby stay with you in your room (rooming in) can give you time to get to know your baby. Rooming in can also help you become comfortable caring for your baby. Breastfeeding can also help you bond with your baby.  How can I plan for returning home with my baby?   Make sure that you have a car seat  installed in your vehicle.  ? Your car seat should be checked by a certified car seat installer to make sure that it is installed safely.  ? Make sure that your baby fits into the car seat safely.   Ask your health care   provider any questions you have about caring for yourself or your baby. Make sure that you are able to contact your health care provider with any questions after leaving the hospital.  This information is not intended to replace advice given to you by your health care provider. Make sure you discuss any questions you have with your health care provider.  Document Released: 06/03/2007 Document Revised: 01/09/2016 Document Reviewed: 07/11/2015  Elsevier Interactive Patient Education  2018 [employer]

## 2017-09-02 NOTE — Discharge Summary (Signed)
OB Discharge Summary     Patient Name: Heather Lin DOB: June 09, 1981 MRN: [id number]  Date of admission: 08/31/2017 Delivering MD: Silverio Lay   Date of discharge: 09/02/2017  Admitting diagnosis: 40 wks every 5 to 10 min Intrauterine pregnancy: [redacted]w[redacted]d     Secondary diagnosis:  Principal Problem:   Vaginal delivery Active Problems:   Anxiety   Depression  Additional problems: None     Discharge diagnosis: Term Pregnancy Delivered                                                                                                Post partum procedures:NA  Augmentation: None  Complications: None  Hospital course:  Onset of Labor With Vaginal Delivery     37 y.o. yo [id number] at [redacted]w[redacted]d was admitted in Active Labor on 08/31/2017. Patient had an uncomplicated labor course as follows:  Membrane Rupture Time/Date: 2:05 PM ,08/31/2017   Intrapartum Procedures: Episiotomy: None [1]                                         Lacerations:  None [1]  Patient had a delivery of a Viable infant. 08/31/2017  Information for the patient's newborn:  Violetta, Lavalle [[id number]]  Delivery Method: Vaginal, Spontaneous(Filed from Delivery Summary)    Pateint had an uncomplicated postpartum course.  She is ambulating, tolerating a regular diet, passing flatus, and urinating well. Patient is discharged home in stable condition on 09/02/17.  She has hx of use of Zoloft and Xanax prior to pregnancy for anxiety/depression.  She has not required any meds during pregnancy, but "wants to be prepared".  Rx Zoloft 50 mg po daily, and Rx Vistaril 50 mg po TID prn anxiety.  Patient will also f/u with her therapist for pp care.  Rx also for Micronor for contraception.  She will continue her iron supplement.  Baby was circumcised by Dr. Su Hilt on day 1.  Physical exam  Vitals:   08/31/17 2235 09/01/17 0616 09/01/17 1834 09/02/17 0543  BP: 121/67 118/63 131/76 123/73  Pulse: (!) 109 (!) 108 (!) 105 (!) 107   Resp: 18 18 18 18   Temp: 98.1 F (36.7 C) 97.8 F (36.6 C) 98.1 F (36.7 C) 98.2 F (36.8 C)  TempSrc:  Oral Oral Oral  SpO2:      Weight:      Height:       General: alert Lochia: appropriate Uterine Fundus: firm Incision: Perineum intact DVT Evaluation: No evidence of DVT seen on physical exam. Labs: CBC Latest Ref Rng & Units 09/01/2017 08/31/2017 05/17/2014  WBC 4.0 - 10.5 K/uL 14.0(H) 9.8 8.6  Hemoglobin 12.0 - 15.0 g/dL 10.6(L) 13.1 12.7  Hematocrit 36.0 - 46.0 % 30.6(L) 38.7 37.8  Platelets 150 - 400 K/uL 228 269 296   CMP Latest Ref Rng & Units 08/31/2017  Glucose 65 - 99 mg/dL 130(Q)  BUN 6 - 20 mg/dL 7  Creatinine 6.57 - 8.46 mg/dL 9.62  Sodium 952 - 841  mmol/L 137  Potassium 3.5 - 5.1 mmol/L 3.7  Chloride 101 - 111 mmol/L 104  CO2 22 - 32 mmol/L 22  Calcium 8.9 - 10.3 mg/dL 9.5  Total Protein 6.5 - 8.1 g/dL 6.8  Total Bilirubin 0.3 - 1.2 mg/dL 0.7  Alkaline Phos 38 - 126 U/L 132(H)  AST 15 - 41 U/L 18  ALT 14 - 54 U/L 14    Discharge instruction: per After Visit Summary and "Baby and Me Booklet".  After visit meds:  Allergies as of 09/02/2017      Reactions   Dilaudid [hydromorphone Hcl] Hives   Can take with benadryl      Medication List    STOP taking these medications   aspirin EC 81 MG tablet   terconazole 0.4 % vaginal cream Commonly known as:  TERAZOL 7     TAKE these medications   acetaminophen 500 MG tablet Commonly known as:  TYLENOL Take 1,000 mg by mouth every 6 (six) hours as needed for moderate pain.   FEROSUL 325 (65 FE) MG tablet Generic drug:  ferrous sulfate Take 1 tablet by mouth daily.   hydrOXYzine 50 MG capsule Commonly known as:  VISTARIL Take 1 capsule (50 mg total) by mouth 3 (three) times daily as needed.   ibuprofen 600 MG tablet Commonly known as:  ADVIL,MOTRIN Take 1 tablet (600 mg total) by mouth every 6 (six) hours.   loratadine 10 MG tablet Commonly known as:  CLARITIN Take 10 mg by mouth daily.    norethindrone 0.35 MG tablet Commonly known as:  ORTHO MICRONOR Take 1 tablet (0.35 mg total) by mouth daily. Start taking on:  09/29/2017   oxyCODONE 5 MG immediate release tablet Commonly known as:  ROXICODONE Take 1 tablet (5 mg total) by mouth every 4 (four) hours as needed for severe pain.   prenatal multivitamin Tabs tablet Take 1 tablet by mouth daily at 12 noon.   sertraline 50 MG tablet Commonly known as:  ZOLOFT Take 1 tablet (50 mg total) by mouth daily.       Diet: routine diet  Activity: Advance as tolerated. Pelvic rest for 6 weeks.   Outpatient follow up:6 weeks Follow up Appt:No future appointments. Follow up Visit:No Follow-up on file.  Postpartum contraception: Progesterone only pills  Newborn Data: Live born female  Birth Weight: 7 lb 10.2 oz (3465 g) APGAR: 9, 9  Newborn Delivery   Birth date/time:  08/31/2017 15:55:00 Delivery type:  Vaginal, Spontaneous     Baby Feeding: Breast Disposition:home with mother   09/02/2017 Nigel BridgemanVicki Anyeli Hockenbury, CNM

## 2017-09-19 ENCOUNTER — Ambulatory Visit: Payer: BLUE CROSS/BLUE SHIELD | Admitting: Internal Medicine

## 2017-09-19 DIAGNOSIS — Z0289 Encounter for other administrative examinations: Secondary | ICD-10-CM

## 2017-09-20 ENCOUNTER — Other Ambulatory Visit: Payer: Self-pay

## 2017-09-20 ENCOUNTER — Ambulatory Visit: Payer: BLUE CROSS/BLUE SHIELD | Admitting: Family Medicine

## 2017-09-20 ENCOUNTER — Encounter: Payer: Self-pay | Admitting: Family Medicine

## 2017-09-20 DIAGNOSIS — M5416 Radiculopathy, lumbar region: Secondary | ICD-10-CM | POA: Diagnosis not present

## 2017-09-20 MED ORDER — PREDNISONE 10 MG PO TABS: ORAL_TABLET | ORAL | 0 refills | Status: DC

## 2017-09-20 NOTE — Assessment & Plan Note (Signed)
Treat with low dose prednisone taper.Molli Knock. Okay to use in lactation.  tylenol for pain. Home PT and heat.

## 2017-09-20 NOTE — Progress Notes (Signed)
Subjective:    Patient ID: Heather Lin, female    DOB: [birth year], 37 y.o.   MRN: [id number]  HPI   37 year old female .Marland Kitchen Never seen in office.. With plans to establish care with  Dr. Darrick Huntsman in 10/2017 presents with new onset pain in legs.  She delivered a baby 3 weeks ago. NSVD, no complication. She is lactating.   She has pain in  off and on in  right thigh  X 5 days, worsening.  Low back in sore... Has history of low back pain with pregnancy.  Pain increased with sitting No leg swelling.  Weakness in left leg with pain.  No numbness.   no chest pain, no SOB.  She has congestion with viral URI.   Hx: anxiety, depression, migraine  Blood pressure 120/90, pulse (!) 110, temperature 98.7 F (37.1 C), temperature source Oral, height 5\' 3"  (1.6 m), weight 172 lb 12 oz (78.4 kg), last menstrual period 11/23/2016, unknown if currently breastfeeding.   Review of Systems  Constitutional: Negative for fatigue and fever.  HENT: Negative for congestion.   Eyes: Negative for pain.  Respiratory: Negative for cough and shortness of breath.   Cardiovascular: Negative for chest pain, palpitations and leg swelling.  Gastrointestinal: Negative for abdominal pain.  Genitourinary: Negative for dysuria and vaginal bleeding.  Musculoskeletal: Negative for back pain.  Neurological: Negative for syncope, light-headedness and headaches.  Psychiatric/Behavioral: Negative for dysphoric mood.       Objective:   Physical Exam  Constitutional: Vital signs are normal. She appears well-developed and well-nourished. She is cooperative.  Non-toxic appearance. She does not appear ill. No distress.  HENT:  Head: Normocephalic.  Right Ear: Hearing, tympanic membrane, external ear and ear canal normal. Tympanic membrane is not erythematous, not retracted and not bulging.  Left Ear: Hearing, tympanic membrane, external ear and ear canal normal. Tympanic membrane is not erythematous, not retracted and  not bulging.  Nose: No mucosal edema or rhinorrhea. Right sinus exhibits no maxillary sinus tenderness and no frontal sinus tenderness. Left sinus exhibits no maxillary sinus tenderness and no frontal sinus tenderness.  Mouth/Throat: Uvula is midline, oropharynx is clear and moist and mucous membranes are normal.  Eyes: Conjunctivae, EOM and lids are normal. Pupils are equal, round, and reactive to light. Lids are everted and swept, no foreign bodies found.  Neck: Trachea normal and normal range of motion. Neck supple. Carotid bruit is not present. No thyroid mass and no thyromegaly present.  Cardiovascular: Regular rhythm, S1 normal, S2 normal, normal heart sounds, intact distal pulses and normal pulses. Tachycardia present. Exam reveals no gallop and no friction rub.  No murmur heard. Pulses:      Carotid pulses are 2+ on the right side, and 2+ on the left side.      Dorsalis pedis pulses are 2+ on the right side, and 2+ on the left side.   No peripheral swelling or pain in calves  Pulmonary/Chest: Effort normal and breath sounds normal. No tachypnea. No respiratory distress. She has no decreased breath sounds. She has no wheezes. She has no rhonchi. She has no rales.  Abdominal: Soft. Normal appearance and bowel sounds are normal. There is no tenderness.  Musculoskeletal:       Lumbar back: She exhibits tenderness.  ttp in right sciatic notch, positive SLR on right  Neurological: She is alert. She has normal strength. No cranial nerve deficit or sensory deficit. Coordination and gait normal.  Skin: Skin is  warm, dry and intact. No rash noted.  Psychiatric: Her speech is normal and behavior is normal. Judgment and thought content normal. Her mood appears not anxious. Cognition and memory are normal. She does not exhibit a depressed mood.         Assessment & Plan:

## 2017-09-20 NOTE — Patient Instructions (Addendum)
Tylenol extra strenght for pain breakthrough.  Complete 6 day course of prednsione Start heat on leg and low back.  Start home low back stretches.  Call if pain is not improving as expected.  increase fluids.

## 2017-10-07 ENCOUNTER — Other Ambulatory Visit: Payer: Self-pay

## 2017-10-07 ENCOUNTER — Ambulatory Visit: Payer: BLUE CROSS/BLUE SHIELD | Admitting: Family Medicine

## 2017-10-07 ENCOUNTER — Encounter: Payer: Self-pay | Admitting: Family Medicine

## 2017-10-07 VITALS — BP 125/84 | HR 94 | Temp 98.5°F | Resp 16 | Ht 63.0 in | Wt 174.6 lb

## 2017-10-07 DIAGNOSIS — M5416 Radiculopathy, lumbar region: Secondary | ICD-10-CM

## 2017-10-07 DIAGNOSIS — M549 Dorsalgia, unspecified: Secondary | ICD-10-CM

## 2017-10-07 MED ORDER — IBUPROFEN 600 MG PO TABS: ORAL_TABLET | ORAL | 1 refills | Status: DC

## 2017-10-07 MED ORDER — CYCLOBENZAPRINE HCL 10 MG PO TABS: 10.0000 mg | ORAL_TABLET | Freq: Three times a day (TID) | ORAL | 1 refills | Status: DC | PRN

## 2017-10-07 NOTE — Progress Notes (Signed)
Chief Complaint  Patient presents with  . New Patient (Initial Visit)    establish care.  Sciatica    HPI  Date of delivery 08/31/17 Patient reports that she delivered  She reports that she took prednisone for sciatica but has not seen much improvement She states that while taking the prednisone her pain got worse She reports that she has upper back pain that shoots up between the shoulder blades  She did not have an epidural She is currently breast feeding She states that she gets the pain at random times and that her pain is sharp and is causes spasms She states that the pain starts lower and goes all the way up to the top of her shoulder blades She gets weakness in her legs but not in her arms She had 2 headaches last week but has a history of migraines   Past Medical History:  Diagnosis Date  . Allergy   . Anxiety   . Depression   . Migraine     Current Outpatient Medications  Medication Sig Dispense Refill  . acetaminophen (TYLENOL) 500 MG tablet Take 1,000 mg by mouth every 6 (six) hours as needed for moderate pain.    Marland Kitchen FEROSUL 325 (65 Fe) MG tablet Take 1 tablet by mouth daily.  3  . ibuprofen (ADVIL,MOTRIN) 600 MG tablet TAKE 1 TABLET(600 MG) BY MOUTH EVERY 6 HOURS with food 60 tablet 1  . loratadine (CLARITIN) 10 MG tablet Take 10 mg by mouth daily.    . Prenatal Vit-Fe Fumarate-FA (PRENATAL MULTIVITAMIN) TABS tablet Take 1 tablet by mouth daily at 12 noon.    . sertraline (ZOLOFT) 50 MG tablet Take 1 tablet (50 mg total) by mouth daily. 30 tablet 11  . cyclobenzaprine (FLEXERIL) 10 MG tablet Take 1 tablet (10 mg total) by mouth 3 (three) times daily as needed for muscle spasms. 60 tablet 1  . norethindrone (ORTHO MICRONOR) 0.35 MG tablet Take 1 tablet (0.35 mg total) by mouth daily. (Patient not taking: Reported on 09/20/2017) 1 Package 11  . oxyCODONE (ROXICODONE) 5 MG immediate release tablet Take 1 tablet (5 mg total) by mouth every 4 (four) hours as needed for  severe pain. (Patient not taking: Reported on 10/07/2017) 10 tablet 0  . predniSONE (DELTASONE) 10 MG tablet 3 tabs by mouth daily x 3 days, then 2 tabs by mouth daily x 2 days then 1 tab by mouth daily x 2 days (Patient not taking: Reported on 10/07/2017) 15 tablet 0   No current facility-administered medications for this visit.     Allergies:  Allergies  Allergen Reactions  . Dilaudid [Hydromorphone Hcl] Hives    Can take with benadryl    Past Surgical History:  Procedure Laterality Date  . APPENDECTOMY    . LAPAROSCOPIC APPENDECTOMY N/A 11/26/2012   Procedure: APPENDECTOMY LAPAROSCOPIC;  Surgeon: Almond Lint, MD;  Location: [facility] OR;  Service: General;  Laterality: N/A;    Social History   Socioeconomic History  . Marital status: Married    Spouse name: None  . Number of children: None  . Years of education: None  . Highest education level: None  Social Needs  . Financial resource strain: None  . Food insecurity - worry: None  . Food insecurity - inability: None  . Transportation needs - medical: None  . Transportation needs - non-medical: None  Occupational History  . None  Tobacco Use  . Smoking status: Never Smoker  . Smokeless tobacco: Never Used  Substance  and Sexual Activity  . Alcohol use: No    Alcohol/week: 0.0 oz    Comment: occasional  . Drug use: No  . Sexual activity: Yes    Birth control/protection: None  Other Topics Concern  . None  Social History Narrative  . None    Family History  Problem Relation Age of Onset  . Cancer Maternal Grandmother   . Anuerysm Maternal Grandfather   . Alzheimer's disease Paternal Grandmother      ROS Review of Systems See HPI Constitution: No fevers or chills No malaise No diaphoresis Skin: No rash or itching Eyes: no blurry vision, no double vision GU: no dysuria or hematuria Neuro: no dizziness or headaches all others reviewed and negative   Objective: Vitals:   10/07/17 1041  BP: 125/84  Pulse:  94  Resp: 16  Temp: 98.5 F (36.9 C)  TempSrc: Oral  SpO2: 98%  Weight: 174 lb 9.6 oz (79.2 kg)  Height: 5\' 3"  (1.6 m)    Physical Exam  Constitutional: She is oriented to person, place, and time. She appears well-developed and well-nourished.  HENT:  Head: Normocephalic and atraumatic.  Eyes: Conjunctivae and EOM are normal.  Neck: Normal range of motion. Neck supple. No thyromegaly present.  Cardiovascular: Normal rate, regular rhythm and normal heart sounds.  No murmur heard. Pulmonary/Chest: Effort normal and breath sounds normal. No stridor. No respiratory distress. She has no wheezes.  Musculoskeletal:       Cervical back: She exhibits spasm. She exhibits normal range of motion, no tenderness, no bony tenderness, no swelling, no edema, no deformity, no laceration, no pain and normal pulse.       Thoracic back: She exhibits spasm. She exhibits normal range of motion, no tenderness, no bony tenderness, no swelling, no edema, no deformity, no laceration and no pain.       Lumbar back: She exhibits spasm. She exhibits normal range of motion, no tenderness, no bony tenderness, no swelling, no edema, no deformity, no laceration and no pain.  Straight leg raise positive at 45 degrees on the right   Neurological: She is alert and oriented to person, place, and time.  Skin: Skin is warm. Capillary refill takes less than 2 seconds.  Psychiatric: She has a normal mood and affect. Her behavior is normal. Judgment and thought content normal.    Assessment and Plan Placido SouRashieda was seen today for new patient (initial visit).  Diagnoses and all orders for this visit:  Right lumbar radiculopathy Acute upper back pain -     Discussed methods for supportive latching Symptoms and exam consistent with muscle spasms Discussed that pregnancy is associated with laxity of the collagen so this has to normalize  -     cyclobenzaprine (FLEXERIL) 10 MG tablet; Take 1 tablet (10 mg total) by mouth 3  (three) times daily as needed for muscle spasms. -     ibuprofen (ADVIL,MOTRIN) 600 MG tablet; TAKE 1 TABLET(600 MG) BY MOUTH EVERY 6 HOURS with food     Shataria Crist A Creta LevinStallings

## 2017-10-07 NOTE — Patient Instructions (Addendum)
Try topical biofreeze Continue to do stretches every day Get a boppy Avoid bringing the breast to the baby and work on the foot ball hold as well as other techniques to support your back    IF you received an x-ray today, you will receive an invoice from [facility]. Please contact [facility] at [phone] with questions or concerns regarding your invoice.   IF you received labwork today, you will receive an invoice from [facility]. Please contact [facility] at [phone] with questions or concerns regarding your invoice.   Our billing staff will not be able to assist you with questions regarding bills from these companies.  You will be contacted with the lab results as soon as they are available. The fastest way to get your results is to activate your My Chart account. Instructions are located on the last page of this paperwork. If you have not heard from us regarding the results in 2 weeks, please contact this office.

## 2017-10-31 ENCOUNTER — Ambulatory Visit: Payer: BLUE CROSS/BLUE SHIELD | Admitting: Internal Medicine

## 2018-01-08 ENCOUNTER — Ambulatory Visit: Payer: BLUE CROSS/BLUE SHIELD | Admitting: Family Medicine

## 2018-01-08 ENCOUNTER — Other Ambulatory Visit: Payer: Self-pay

## 2018-01-08 ENCOUNTER — Encounter: Payer: Self-pay | Admitting: Family Medicine

## 2018-01-08 VITALS — BP 111/78 | HR 96 | Temp 98.8°F | Resp 16 | Ht 63.0 in | Wt 177.2 lb

## 2018-01-08 DIAGNOSIS — O99345 Other mental disorders complicating the puerperium: Secondary | ICD-10-CM

## 2018-01-08 DIAGNOSIS — F53 Postpartum depression: Secondary | ICD-10-CM | POA: Diagnosis not present

## 2018-01-08 NOTE — Patient Instructions (Signed)
     IF you received an x-ray today, you will receive an invoice from [facility]. Please contact [facility] at [phone] with questions or concerns regarding your invoice.   IF you received labwork today, you will receive an invoice from [facility]. Please contact [facility] at [phone] with questions or concerns regarding your invoice.   Our billing staff will not be able to assist you with questions regarding bills from these companies.  You will be contacted with the lab results as soon as they are available. The fastest way to get your results is to activate your My Chart account. Instructions are located on the last page of this paperwork. If you have not heard from us regarding the results in 2 weeks, please contact this office.     

## 2018-01-08 NOTE — Progress Notes (Signed)
Chief Complaint  Patient presents with  . 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  Date of Delivery 08/31/2017 She reports that she has had depression since delivery Depression screen [facility] 2/9 01/08/2018 10/07/2017 07/09/2015  Decreased Interest 3 0 0  Down, Depressed, Hopeless 3 0 0  PHQ - 2 Score 6 0 0  Altered sleeping 3 - -  Tired, decreased energy 3 - -  Change in appetite 3 - -  Feeling bad or failure about yourself  3 - -  Trouble concentrating 3 - -  Moving slowly or fidgety/restless 3 - -  Suicidal thoughts 0 - -  PHQ-9 Score 24 - -  Difficult doing work/chores Extremely dIfficult - -      4 review of systems  Past Medical History:  Diagnosis Date  . Allergy   . Anxiety   . Depression   . Migraine     Current Outpatient Medications  Medication Sig Dispense Refill  . acetaminophen (TYLENOL) 500 MG tablet Take 1,000 mg by mouth every 6 (six) hours as needed for moderate pain.    Marland Kitchen FEROSUL 325 (65 Fe) MG tablet Take 1 tablet by mouth daily.  3  . ibuprofen (ADVIL,MOTRIN) 600 MG tablet TAKE 1 TABLET(600 MG) BY MOUTH EVERY 6 HOURS with food 60 tablet 1  . loratadine (CLARITIN) 10 MG tablet Take 10 mg by mouth daily.    . norethindrone (ORTHO MICRONOR) 0.35 MG tablet Take 1 tablet (0.35 mg total) by mouth daily. 1 Package 11  . Prenatal Vit-Fe Fumarate-FA (PRENATAL MULTIVITAMIN) TABS tablet Take 1 tablet by mouth daily at 12 noon.    . sertraline (ZOLOFT) 50 MG tablet Take 1 tablet (50 mg total) by mouth daily. 30  tablet 11   No current facility-administered medications for this visit.     Allergies:  Allergies  Allergen Reactions  . Dilaudid [Hydromorphone Hcl] Hives    Can take with benadryl    Past Surgical History:  Procedure Laterality Date  . APPENDECTOMY    . LAPAROSCOPIC APPENDECTOMY N/A 11/26/2012   Procedure: APPENDECTOMY LAPAROSCOPIC;  Surgeon: Almond Lint, MD;  Location: [facility] OR;  Service: General;  Laterality: N/A;    Social History   Socioeconomic History  . Marital status: Married    Spouse name: Not on file  . Number of children: Not on file  . Years of education: Not on file  . Highest education level: Not on file  Occupational History  . Not on file  Social Needs  . Financial resource strain: Not on file  . Food insecurity:    Worry: Not on file    Inability: Not on file  . Transportation needs:    Medical: Not on file    Non-medical: Not on file  Tobacco Use  . Smoking status: Never Smoker  . Smokeless tobacco: Never Used  Substance and Sexual Activity  . Alcohol use: No    Alcohol/week: 0.0 oz    Comment: occasional  . Drug use: No  .  Sexual activity: Yes    Birth control/protection: None  Lifestyle  . Physical activity:    Days per week: Not on file    Minutes per session: Not on file  . Stress: Not on file  Relationships  . Social connections:    Talks on phone: Not on file    Gets together: Not on file    Attends religious service: Not on file    Active member of club or organization: Not on file    Attends meetings of clubs or organizations: Not on file    Relationship status: Not on file  Other Topics Concern  . Not on file  Social History Narrative  . Not on file    Family History  Problem Relation Age of Onset  . Cancer Maternal Grandmother   . Anuerysm Maternal Grandfather   . Alzheimer's disease Paternal Grandmother      ROS Review of Systems See HPI Constitution: No fevers or chills No malaise No diaphoresis Skin: No  rash or itching Eyes: no blurry vision, no double vision GU: no dysuria or hematuria Neuro: no dizziness or headaches all others reviewed and negative   Objective: Vitals:   01/08/18 1206  BP: 111/78  Pulse: 96  Resp: 16  Temp: 98.8 F (37.1 C)  TempSrc: Oral  SpO2: 98%  Weight: 177 lb 3.2 oz (80.4 kg)  Height:  (1.6 m)    Physical Exam  Constitutional: She is oriented to person, place, and time. She appears well-developed and well-nourished.  HENT:  Head: Normocephalic and atraumatic.  Eyes: Conjunctivae and EOM are normal.  Neck: Normal range of motion. Neck supple. No thyromegaly present.  Cardiovascular: Normal rate, regular rhythm and normal heart sounds.  No murmur heard. Pulmonary/Chest: Effort normal and breath sounds normal. No stridor. No respiratory distress.  Neurological: She is alert and oriented to person, place, and time.  Skin: Skin is warm. Capillary refill takes less than 2 seconds.  Psychiatric: She has a normal mood and affect. Her behavior is normal. Judgment and thought content normal.    Assessment and Plan Heather Lin was seen today for disabiltiy f/u.  Diagnoses and all orders for this visit:  Postpartum depression -     CBC -     TSH + free T4  -  Would advised being off work to get mental health care Due to postpartum depression having the change of progressing to psychosis  Would recommend 90 days    A total of 30 minutes were spent face-to-face with the patient during this encounter and over half of that time was spent on counseling and coordination of care.   Triton Heidrich A Lathyn Griggs

## 2018-01-09 LAB — CBC
Hematocrit: 36.9 % (ref 34.0–46.6)
Hemoglobin: 12.6 g/dL (ref 11.1–15.9)
MCH: 28.5 pg (ref 26.6–33.0)
MCHC: 34.1 g/dL (ref 31.5–35.7)
MCV: 84 fL (ref 79–97)
PLATELETS: 318 10*3/uL (ref 150–450)
RBC: 4.42 x10E6/uL (ref 3.77–5.28)
RDW: 14.7 % (ref 12.3–15.4)
WBC: 6 10*3/uL (ref 3.4–10.8)

## 2018-01-09 LAB — COMPREHENSIVE METABOLIC PANEL
ALBUMIN: 4.3 g/dL (ref 3.5–5.5)
ALK PHOS: 92 IU/L (ref 39–117)
ALT: 28 IU/L (ref 0–32)
AST: 19 IU/L (ref 0–40)
Albumin/Globulin Ratio: 1.4 (ref 1.2–2.2)
BUN / CREAT RATIO: 16 (ref 9–23)
BUN: 12 mg/dL (ref 6–20)
Bilirubin Total: 0.5 mg/dL (ref 0.0–1.2)
CALCIUM: 9.9 mg/dL (ref 8.7–10.2)
CO2: 22 mmol/L (ref 20–29)
CREATININE: 0.75 mg/dL (ref 0.57–1.00)
Chloride: 103 mmol/L (ref 96–106)
GFR calc Af Amer: 118 mL/min/{1.73_m2} (ref >59)
GFR calc non Af Amer: 102 mL/min/{1.73_m2} (ref >59)
Globulin, Total: 3 g/dL (ref 1.5–4.5)
Glucose: 98 mg/dL (ref 65–99)
POTASSIUM: 4 mmol/L (ref 3.5–5.2)
Sodium: 139 mmol/L (ref 134–144)
Total Protein: 7.3 g/dL (ref 6.0–8.5)

## 2018-01-09 LAB — TSH+FREE T4
Free T4: 1.08 ng/dL (ref 0.82–1.77)
TSH: 1.4 u[IU]/mL (ref 0.450–4.500)

## 2018-01-14 ENCOUNTER — Encounter: Payer: Self-pay | Admitting: Physician Assistant

## 2018-01-14 ENCOUNTER — Ambulatory Visit (INDEPENDENT_AMBULATORY_CARE_PROVIDER_SITE_OTHER): Payer: BLUE CROSS/BLUE SHIELD

## 2018-01-14 ENCOUNTER — Telehealth: Payer: Self-pay | Admitting: Family Medicine

## 2018-01-14 ENCOUNTER — Ambulatory Visit: Payer: BLUE CROSS/BLUE SHIELD | Admitting: Physician Assistant

## 2018-01-14 ENCOUNTER — Other Ambulatory Visit: Payer: Self-pay

## 2018-01-14 VITALS — BP 108/70 | HR 92 | Temp 98.3°F | Resp 16 | Ht 63.0 in | Wt 181.8 lb

## 2018-01-14 DIAGNOSIS — M545 Low back pain: Secondary | ICD-10-CM | POA: Diagnosis not present

## 2018-01-14 LAB — POCT URINALYSIS DIP (MANUAL ENTRY)
Bilirubin, UA: NEGATIVE
Blood, UA: NEGATIVE
Glucose, UA: NEGATIVE mg/dL
Ketones, POC UA: NEGATIVE mg/dL
Nitrite, UA: NEGATIVE
Protein Ur, POC: NEGATIVE mg/dL
Spec Grav, UA: >=1.03 — AB (ref 1.010–1.025)
Urobilinogen, UA: 0.2 E.U./dL
pH, UA: 5.5 (ref 5.0–8.0)

## 2018-01-14 MED ORDER — CYCLOBENZAPRINE HCL 10 MG PO TABS: 10.0000 mg | ORAL_TABLET | Freq: Three times a day (TID) | ORAL | 1 refills | Status: DC | PRN

## 2018-01-14 MED ORDER — IBUPROFEN 600 MG PO TABS: ORAL_TABLET | ORAL | 1 refills | Status: DC

## 2018-01-14 NOTE — Patient Instructions (Addendum)
Your x-rays look great!  You will receive a phone call to schedule an appointment with physical therapy for dry needling.  We will call you if there are any abnormalities with your urine culture.   In the meantime, take ibuprofen and flexeril for pain.  Apply heating pad.  Continue stretching.  You can buy a foam roller for your back.   Trigger Point Dry Needling   What is Trigger Point Dry Needling (DN)?   1. DN is a physical therapy technique used to treat muscle pain and     Dysfunction.  Specifically, DN helps deactivate muscle trigger points (Muscle Knots).   2. A thin filiform needle is used to penetrate the skin and stimulate the underlying trigger point.  The goal is for a local twitch response (LTR) to occur and for the trigger point to relax.  No medication of any kind is injected during the procedure.   What Does Trigger Point Dry Needling Feel Like?   1. The procedures feels different for each individual patient.   Some patients report that they do not actually feel the needle enter the skin and overall the process is not painful.  Very mild bleeding may occur.  However, many patients feel a deep cramping in the muscle in which the needle was inserted. This is the local twitch response.    How Will I Feel After The Treatment?   1. Soreness is normal, and the onset of soreness may not occur for a few hours.  Typically this soreness does not last longer than two days.   2. Bruising is uncommon, however; ice can be used to decrease any    possible bruising.   3. In rare cases feeling tired or nauseous after the treatment is    normal.  In addition, your symptoms may get worse before they get better, this period will typically not last longer than 24 hours.   What Can I do After My Treatment?   1.  Increase your hydration by drinking more water for the next 24 hours.   2.  You may place ice or heat on the areas treated that have become sore, however don not use heat on  inflamed or bruised areas.  Heat often brings more relief post needling.   3. You can continue your regular activities, but vigorous activity is not recommended initially after the treatment for 24 hours.   4. DN is best combined with other physical therapy such as strengthening, stretching, and other therapies.    Thank you for coming in today. I hope you feel we met your needs.  Feel free to call PCP if you have any questions or further requests.  Please consider signing up for MyChart if you do not already have it, as this is a great way to communicate with me.  Best,  Whitney McVey, PA-C   IF you received an x-ray today, you will receive an invoice from [facility]. Please contact [facility] at [phone] with questions or concerns regarding your invoice.   IF you received labwork today, you will receive an invoice from [facility]. Please contact [facility] at [phone] with questions or concerns regarding your invoice.   Our billing staff will not be able to assist you with questions regarding bills from these companies.  You will be contacted with the lab results as soon as they are available. The fastest way to get your results is to activate your My Chart account. Instructions are located on the last  page of this paperwork. If you have not heard from [place] regarding the results in 2 weeks, please contact this office.

## 2018-01-14 NOTE — Progress Notes (Signed)
Heather Lin  MRN: [id number] DOB: Jan 11, 1981  PCP: Doristine Bosworth, MD  Subjective:  Pt is a 37 year old female who presents to clinic for back pain x 3 days. Symptoms started after she was cleaning the house. Pain is on left side.  Pain radiates down leg. She has tried ibuprofen. She got a massage yesterday - did not help. She has been stretching - not helping.    Denies urinary symptoms, saddle paresthesia, loss or bowel function, n/t LES.  She has been suffering from back pain off and on for the past 15 or so years.   She was here 2/18 for back pain, treated with flexeril and ibuprofen, advised to try topical Biofreeze and stretches every day.  Review of Systems  Constitutional: Negative for chills, fatigue and fever.  Respiratory: Negative for cough, shortness of breath and wheezing.   Gastrointestinal: Negative for abdominal pain, diarrhea, nausea and vomiting.  Genitourinary: Negative for decreased urine volume, difficulty urinating, dysuria, enuresis, flank pain, frequency, hematuria and urgency.  Musculoskeletal: Positive for back pain.  Neurological: Negative for dizziness, weakness, light-headedness and headaches.    Patient Active Problem List   Diagnosis Date Noted  . Right lumbar radiculopathy 09/20/2017  . Vaginal delivery 09/02/2017  . Anxiety 09/02/2017  . Depression 09/02/2017  . Family history of blood coagulation disorder 10/30/2016    Current Outpatient Medications on File Prior to Visit  Medication Sig Dispense Refill  . acetaminophen (TYLENOL) 500 MG tablet Take 1,000 mg by mouth every 6 (six) hours as needed for moderate pain.    Marland Kitchen FEROSUL 325 (65 Fe) MG tablet Take 1 tablet by mouth daily.  3  . ibuprofen (ADVIL,MOTRIN) 600 MG tablet TAKE 1 TABLET(600 MG) BY MOUTH EVERY 6 HOURS with food 60 tablet 1  . Multiple Vitamins-Minerals (WOMENS 50+ MULTI VITAMIN/MIN PO) Take by mouth.    . norethindrone (ORTHO MICRONOR) 0.35 MG tablet Take 1 tablet  (0.35 mg total) by mouth daily. 1 Package 11  . Prenatal Vit-Fe Fumarate-FA (PRENATAL MULTIVITAMIN) TABS tablet Take 1 tablet by mouth daily at 12 noon.    . sertraline (ZOLOFT) 50 MG tablet Take 1 tablet (50 mg total) by mouth daily. 30 tablet 11   No current facility-administered medications on file prior to visit.     Allergies  Allergen Reactions  . Dilaudid [Hydromorphone Hcl] Hives    Can take with benadryl     Objective:  BP 108/70 (BP Location: Right Arm, Cuff Size: Normal)   Pulse 92   Temp 98.3 F (36.8 C) (Oral)   Resp 16   Ht  (1.6 m)   Wt 181 lb 12.8 oz (82.5 kg)   SpO2 97%   BMI 32.20 kg/m   Physical Exam  Constitutional: She is oriented to person, place, and time. No distress.  Cardiovascular: Normal rate, regular rhythm and normal heart sounds.  Musculoskeletal:       Lumbar back: She exhibits decreased range of motion, tenderness and bony tenderness.       Back:  Neurological: She is alert and oriented to person, place, and time.  Skin: Skin is warm and dry.  Psychiatric: Judgment normal.  Vitals reviewed.  Dg Lumbar Spine Complete  Result Date: 01/14/2018 CLINICAL DATA:  Worsening chronic low back pain.  No known injury. EXAM: LUMBAR SPINE - COMPLETE 4+ VIEW COMPARISON:  None. FINDINGS: There is no evidence of lumbar spine fracture. Alignment is normal. Intervertebral disc spaces are maintained. IMPRESSION: Normal  exam. Electronically Signed   By: Drusilla Kanner M.D.   On: 01/14/2018 10:50   Dg Sacrum/coccyx  Result Date: 01/14/2018 CLINICAL DATA:  Worsening chronic low back pain.  No known injury. EXAM: SACRUM AND COCCYX - 2+ VIEW COMPARISON:  None. FINDINGS: There is no evidence of fracture or other focal bone lesions. IMPRESSION: Negative exam. Electronically Signed   By: Drusilla Kanner M.D.   On: 01/14/2018 10:51    Assessment and Plan :  1. Acute left-sided low back pain, with sciatica presence unspecified - Pt presents with low back  pain x 3 days. X-rays are negative. UA is inconclusive, will await culture.Marland Kitchen Suspect MSK pain. No sign of cauda equina. Home care discussed for back pain relief.  - POCT urinalysis dipstick - Urine Culture - ibuprofen (ADVIL,MOTRIN) 600 MG tablet; TAKE 1 TABLET(600 MG) BY MOUTH EVERY 6 HOURS with food  Dispense: 60 tablet; Refill: 1 - cyclobenzaprine (FLEXERIL) 10 MG tablet; Take 1 tablet (10 mg total) by mouth 3 (three) times daily as needed for muscle spasms.  Dispense: 30 tablet; Refill: 1 - Ambulatory referral to Physical Therapy - DG Lumbar Spine Complete; Future - DG Sacrum/Coccyx; Future   Heather Collie, PA-C  Primary Care at [facility] 01/14/2018 10:10 AM

## 2018-01-14 NOTE — Telephone Encounter (Signed)
Patient needs FMLA forms completed by Dr Creta Levin for her last OV for postpartum depression. I have completed the forms they just need to be signed. I will place the forms in Dr Creta Levin box on 01/14/18, please return to the FMLA/Disability desk within 5-7 business days. Thank you !

## 2018-01-15 LAB — URINE CULTURE

## 2018-01-20 NOTE — Telephone Encounter (Signed)
Paperwork scanned and faxed on 01/20/18

## 2018-02-07 ENCOUNTER — Telehealth: Payer: Self-pay | Admitting: Physician Assistant

## 2018-02-07 NOTE — Telephone Encounter (Signed)
Done. Thank you.

## 2018-02-07 NOTE — Telephone Encounter (Signed)
Hey notes on 5/28 are still incomplete/not signed off, once completed I will send physical therapy referral. Thanks!

## 2018-02-12 NOTE — Telephone Encounter (Signed)
Pt has been stcheduled with [facility] on 02/12/18 at [facility]PM

## 2018-04-28 ENCOUNTER — Telehealth: Payer: Self-pay | Admitting: Family Medicine

## 2018-04-28 NOTE — Telephone Encounter (Signed)
I will place the blank forms in Dr Creta Levin box on 04/28/18.

## 2018-04-28 NOTE — Telephone Encounter (Signed)
Patient has an appointment on 05/01/18 to go over FMLA forms. Please make sure a copy is returned to the FMLA/Disability desk after her appointment.

## 2018-04-28 NOTE — Telephone Encounter (Signed)
Error

## 2018-05-01 ENCOUNTER — Ambulatory Visit: Payer: BLUE CROSS/BLUE SHIELD | Admitting: Family Medicine

## 2018-05-01 NOTE — Progress Notes (Deleted)
No chief complaint on file.   HPI  4 review of systems  Past Medical History:  Diagnosis Date  . Allergy   . Anxiety   . Depression   . Migraine     Current Outpatient Medications  Medication Sig Dispense Refill  . acetaminophen (TYLENOL) 500 MG tablet Take 1,000 mg by mouth every 6 (six) hours as needed for moderate pain.    . cyclobenzaprine (FLEXERIL) 10 MG tablet Take 1 tablet (10 mg total) by mouth 3 (three) times daily as needed for muscle spasms. 30 tablet 1  . FEROSUL 325 (65 Fe) MG tablet Take 1 tablet by mouth daily.  3  . ibuprofen (ADVIL,MOTRIN) 600 MG tablet TAKE 1 TABLET(600 MG) BY MOUTH EVERY 6 HOURS with food 60 tablet 1  . Multiple Vitamins-Minerals (WOMENS 50+ MULTI VITAMIN/MIN PO) Take by mouth.    . norethindrone (ORTHO MICRONOR) 0.35 MG tablet Take 1 tablet (0.35 mg total) by mouth daily. 1 Package 11  . Prenatal Vit-Fe Fumarate-FA (PRENATAL MULTIVITAMIN) TABS tablet Take 1 tablet by mouth daily at 12 noon.    . sertraline (ZOLOFT) 50 MG tablet Take 1 tablet (50 mg total) by mouth daily. 30 tablet 11   No current facility-administered medications for this visit.     Allergies:  Allergies  Allergen Reactions  . Dilaudid [Hydromorphone Hcl] Hives    Can take with benadryl    Past Surgical History:  Procedure Laterality Date  . APPENDECTOMY    . LAPAROSCOPIC APPENDECTOMY N/A 11/26/2012   Procedure: APPENDECTOMY LAPAROSCOPIC;  Surgeon: Almond LintFaera Byerly, MD;  Location: [facility] OR;  Service: General;  Laterality: N/A;    Social History   Socioeconomic History  . Marital status: Married    Spouse name: Not on file  . Number of children: Not on file  . Years of education: Not on file  . Highest education level: Not on file  Occupational History  . Not on file  Social Needs  . Financial resource strain: Not on file  . Food insecurity:    Worry: Not on file    Inability: Not on file  . Transportation needs:    Medical: Not on file    Non-medical: Not on  file  Tobacco Use  . Smoking status: Never Smoker  . Smokeless tobacco: Never Used  Substance and Sexual Activity  . Alcohol use: No    Alcohol/week: 0.0 standard drinks    Comment: occasional  . Drug use: No  . Sexual activity: Yes    Birth control/protection: None  Lifestyle  . Physical activity:    Days per week: Not on file    Minutes per session: Not on file  . Stress: Not on file  Relationships  . Social connections:    Talks on phone: Not on file    Gets together: Not on file    Attends religious service: Not on file    Active member of club or organization: Not on file    Attends meetings of clubs or organizations: Not on file    Relationship status: Not on file  Other Topics Concern  . Not on file  Social History Narrative  . Not on file    Family History  Problem Relation Age of Onset  . Cancer Maternal Grandmother   . Anuerysm Maternal Grandfather   . Alzheimer's disease Paternal Grandmother      ROS Review of Systems See HPI Constitution: No fevers or chills No malaise No diaphoresis Skin: No rash or  itching Eyes: no blurry vision, no double vision GU: no dysuria or hematuria Neuro: no dizziness or headaches * all others reviewed and negative   Objective: There were no vitals filed for this visit.  Physical Exam  Assessment and Plan There are no diagnoses linked to this encounter.   Zakiah Gauthreaux P [employer]

## 2018-05-09 NOTE — Telephone Encounter (Signed)
[facility]

## 2018-10-03 DIAGNOSIS — F3181 Bipolar II disorder: Secondary | ICD-10-CM | POA: Diagnosis not present

## 2018-10-06 DIAGNOSIS — F4010 Social phobia, unspecified: Secondary | ICD-10-CM | POA: Diagnosis not present

## 2018-10-06 DIAGNOSIS — F411 Generalized anxiety disorder: Secondary | ICD-10-CM | POA: Diagnosis not present

## 2018-10-06 DIAGNOSIS — F3170 Bipolar disorder, currently in remission, most recent episode unspecified: Secondary | ICD-10-CM | POA: Diagnosis not present

## 2018-11-04 DIAGNOSIS — F411 Generalized anxiety disorder: Secondary | ICD-10-CM | POA: Diagnosis not present

## 2018-11-04 DIAGNOSIS — F3170 Bipolar disorder, currently in remission, most recent episode unspecified: Secondary | ICD-10-CM | POA: Diagnosis not present

## 2018-11-04 DIAGNOSIS — F4010 Social phobia, unspecified: Secondary | ICD-10-CM | POA: Diagnosis not present

## 2018-12-03 DIAGNOSIS — F3181 Bipolar II disorder: Secondary | ICD-10-CM | POA: Diagnosis not present

## 2019-01-06 DIAGNOSIS — F4010 Social phobia, unspecified: Secondary | ICD-10-CM | POA: Diagnosis not present

## 2019-01-06 DIAGNOSIS — F411 Generalized anxiety disorder: Secondary | ICD-10-CM | POA: Diagnosis not present

## 2019-01-06 DIAGNOSIS — F3170 Bipolar disorder, currently in remission, most recent episode unspecified: Secondary | ICD-10-CM | POA: Diagnosis not present

## 2019-02-03 DIAGNOSIS — F411 Generalized anxiety disorder: Secondary | ICD-10-CM | POA: Diagnosis not present

## 2019-02-03 DIAGNOSIS — F4010 Social phobia, unspecified: Secondary | ICD-10-CM | POA: Diagnosis not present

## 2019-02-03 DIAGNOSIS — F3170 Bipolar disorder, currently in remission, most recent episode unspecified: Secondary | ICD-10-CM | POA: Diagnosis not present

## 2019-03-03 DIAGNOSIS — F411 Generalized anxiety disorder: Secondary | ICD-10-CM | POA: Diagnosis not present

## 2019-03-03 DIAGNOSIS — F4010 Social phobia, unspecified: Secondary | ICD-10-CM | POA: Diagnosis not present

## 2019-03-03 DIAGNOSIS — F3170 Bipolar disorder, currently in remission, most recent episode unspecified: Secondary | ICD-10-CM | POA: Diagnosis not present

## 2019-03-20 ENCOUNTER — Other Ambulatory Visit: Payer: Self-pay

## 2019-03-20 ENCOUNTER — Telehealth: Payer: Self-pay | Admitting: Family Medicine

## 2019-03-20 NOTE — Telephone Encounter (Signed)
Pt is in pain and would like to have meds filled asap  Please call pt

## 2019-03-20 NOTE — Telephone Encounter (Signed)
Pt is asking for med for pain and muscle relaxer

## 2019-03-20 NOTE — Telephone Encounter (Signed)
Relation to pt: self  Call back number: [place]: [facility] [place], [place] [phone] (Phone) [phone] (Fax)     Reason for call:  Patient states she's experiencing back pain (flare up) and would like PCP to prescribe as soon as possible: cyclobenzaprine (FLEXERIL) 10 MG tablet

## 2019-03-20 NOTE — Telephone Encounter (Signed)
Pt calling in for cyclobenzaprine and ibuprofen 600 mg. Pharmacy has faxed over request for cyclobenzaprine Today. Pt in pain and needing refills as soon as possible

## 2019-03-20 NOTE — Telephone Encounter (Signed)
Patient called again to ask that the doctor send an addition script strictly for pain.  She stated that she is having severe pain and needs some treatment.

## 2019-03-22 ENCOUNTER — Other Ambulatory Visit: Payer: Self-pay | Admitting: Family Medicine

## 2019-03-22 DIAGNOSIS — M545 Low back pain, unspecified: Secondary | ICD-10-CM

## 2019-03-22 NOTE — Telephone Encounter (Signed)
Requested medication (s) are due for refill today: yes  Requested medication (s) are on the active medication list: yes  Last refill:  01/14/18  Future visit scheduled: no  Notes to clinic:  Medication not delegated to NT to refill  Requested Prescriptions  Pending Prescriptions Disp Refills   cyclobenzaprine (FLEXERIL) 10 MG tablet [Pharmacy Med Name: CYCLOBENZAPRINE 10MG  TABLETS] 30 tablet 1    Sig: TAKE 1 TABLET(10 MG) BY MOUTH THREE TIMES DAILY AS NEEDED FOR MUSCLE SPASMS     Not Delegated - Analgesics:  Muscle Relaxants Failed - 03/22/2019  1:58 PM      Failed - This refill cannot be delegated      Failed - Valid encounter within last 6 months    Recent Outpatient Visits          1 year ago Acute left-sided low back pain, with sciatica presence unspecified   [facility], Gelene Mink, PA-C   1 year ago Postpartum depression   [facility] at [facility], Arlie Solomons, MD   1 year ago Right lumbar radiculopathy   [facility] at [place], MD   3 years ago Situational anxiety   [facility], Ranell Patrick, MD   4 years ago Stress   [facility], Ranell Patrick, MD

## 2019-03-23 NOTE — Telephone Encounter (Signed)
Since pt does have an upcoming appt on 8.12.20  Pt has not been seen since 01/14/18  No showed her 05/01/18   Last sent by McVey for acute left side/back pain.  Please advise if refill is appropriate

## 2019-03-24 NOTE — Telephone Encounter (Signed)
Patient schedule 08/12.

## 2019-04-01 ENCOUNTER — Telehealth (INDEPENDENT_AMBULATORY_CARE_PROVIDER_SITE_OTHER): Payer: BC Managed Care – PPO | Admitting: Family Medicine

## 2019-04-01 ENCOUNTER — Encounter: Payer: Self-pay | Admitting: Family Medicine

## 2019-04-01 ENCOUNTER — Other Ambulatory Visit: Payer: Self-pay

## 2019-04-01 DIAGNOSIS — G8929 Other chronic pain: Secondary | ICD-10-CM

## 2019-04-01 DIAGNOSIS — M5442 Lumbago with sciatica, left side: Secondary | ICD-10-CM | POA: Diagnosis not present

## 2019-04-01 DIAGNOSIS — M5441 Lumbago with sciatica, right side: Secondary | ICD-10-CM

## 2019-04-01 MED ORDER — CYCLOBENZAPRINE HCL 10 MG PO TABS: ORAL_TABLET | ORAL | 3 refills | Status: DC

## 2019-04-01 NOTE — Progress Notes (Signed)
Telemedicine Encounter- SOAP NOTE Established Patient  This telephone encounter was conducted with the patient's (or proxy's) verbal consent via audio telecommunications: yes/no: Yes Patient was instructed to have this encounter in a suitably private space; and to only have persons present to whom they give permission to participate. In addition, patient identity was confirmed by use of name plus two identifiers (DOB and address).  I discussed the limitations, risks, security and privacy concerns of performing an evaluation and management service by telephone and the availability of in person appointments. I also discussed with the patient that there may be a patient responsible charge related to this service. The patient expressed understanding and agreed to proceed.  I spent a total of TIME; 0 MIN TO 60 MIN: 15 minutes talking with the patient or their proxy.  CC:  Muscle relaxer  Subjective   Heather Lin is a 38 y.o. established patient. Telephone visit today for  HPI Patient reports that she is having muscle spasms up her back. She reports that she had a refill but need it more lately due to increasing back pain that comes as waves over the course of the day.  She wakes up with pain and reports that she has radiating pain down the side of her left. She denies leakage of urine No numbness, no tingling   Patient Active Problem List   Diagnosis Date Noted  . Right lumbar radiculopathy 09/20/2017  . Vaginal delivery 09/02/2017  . Anxiety 09/02/2017  . Depression 09/02/2017  . Family history of blood coagulation disorder 10/30/2016    Past Medical History:  Diagnosis Date  . Allergy   . Anxiety   . Depression   . Migraine     Current Outpatient Medications  Medication Sig Dispense Refill  . cyclobenzaprine (FLEXERIL) 10 MG tablet TAKE 1 TABLET(10 MG) BY MOUTH THREE TIMES DAILY AS NEEDED FOR MUSCLE SPASMS 90 tablet 3  . FEROSUL 325 (65 Fe) MG tablet Take 1 tablet by  mouth daily.  3  . ibuprofen (ADVIL,MOTRIN) 600 MG tablet TAKE 1 TABLET(600 MG) BY MOUTH EVERY 6 HOURS with food 60 tablet 1  . Multiple Vitamins-Minerals (WOMENS 50+ MULTI VITAMIN/MIN PO) Take by mouth.    . norethindrone (ORTHO MICRONOR) 0.35 MG tablet Take 1 tablet (0.35 mg total) by mouth daily. 1 Package 11  . Prenatal Vit-Fe Fumarate-FA (PRENATAL MULTIVITAMIN) TABS tablet Take 1 tablet by mouth daily at 12 noon.     No current facility-administered medications for this visit.     Allergies  Allergen Reactions  . Dilaudid [Hydromorphone Hcl] Hives    Can take with benadryl    Social History   Socioeconomic History  . Marital status: Married    Spouse name: Not on file  . Number of children: Not on file  . Years of education: Not on file  . Highest education level: Not on file  Occupational History  . Not on file  Social Needs  . Financial resource strain: Not on file  . Food insecurity    Worry: Not on file    Inability: Not on file  . Transportation needs    Medical: Not on file    Non-medical: Not on file  Tobacco Use  . Smoking status: Never Smoker  . Smokeless tobacco: Never Used  Substance and Sexual Activity  . Alcohol use: No    Alcohol/week: 0.0 standard drinks    Comment: occasional  . Drug use: No  . Sexual activity: Yes  Birth control/protection: None  Lifestyle  . Physical activity    Days per week: Not on file    Minutes per session: Not on file  . Stress: Not on file  Relationships  . Social Musicianconnections    Talks on phone: Not on file    Gets together: Not on file    Attends religious service: Not on file    Active member of club or organization: Not on file    Attends meetings of clubs or organizations: Not on file    Relationship status: Not on file  . Intimate partner violence    Fear of current or ex partner: Not on file    Emotionally abused: Not on file    Physically abused: Not on file    Forced sexual activity: Not on file   Other Topics Concern  . Not on file  Social History Narrative  . Not on file    ROS Review of Systems  Constitutional: Negative for activity change, appetite change, chills and fever.  HENT: Negative for congestion, nosebleeds, trouble swallowing and voice change.   Respiratory: Negative for cough, shortness of breath and wheezing.   Gastrointestinal: Negative for diarrhea, nausea and vomiting.  Genitourinary: Negative for difficulty urinating, dysuria, flank pain and hematuria.  Musculoskeletal: see hpi Neurological: Negative for dizziness, speech difficulty, light-headedness and numbness.  See HPI. All other review of systems negative.   Objective   Vitals as reported by the patient: There were no vitals filed for this visit.  Diagnoses and all orders for this visit:  Acute left-sided low back pain -     cyclobenzaprine (FLEXERIL) 10 MG tablet; TAKE 1 TABLET(10 MG) BY MOUTH THREE TIMES DAILY AS NEEDED FOR MUSCLE SPASMS  patient was given the warning signs for back pain Advised patient to continue stretches Prn flexeril  Follow up for physical exam with pap    I discussed the assessment and treatment plan with the patient. The patient was provided an opportunity to ask questions and all were answered. The patient agreed with the plan and demonstrated an understanding of the instructions.   The patient was advised to call back or seek an in-person evaluation if the symptoms worsen or if the condition fails to improve as anticipated.  I provided 15 minutes of non-face-to-face time during this encounter.  Doristine BosworthZoe A Cree Kunert, MD  [facility]

## 2019-04-01 NOTE — Progress Notes (Signed)
Pt is needing a refill on the flexeril due to the muscle spasm in the back. She is having no other medical concerns at this tie. She says she will be making an appt or pap with you for her nx ov. She does not have an gyn at this time. Medication and pharmacy verified

## 2019-04-03 NOTE — Telephone Encounter (Signed)
Pt is calling wanting a prescription for Prednisone for her back spasms. Please advise.

## 2019-04-03 NOTE — Telephone Encounter (Signed)
Please advise.  Pt had telemed visit on 04/01/2019.

## 2019-04-08 ENCOUNTER — Telehealth: Payer: Self-pay

## 2019-04-08 MED ORDER — GABAPENTIN 100 MG PO CAPS: 300.0000 mg | ORAL_CAPSULE | Freq: Two times a day (BID) | ORAL | 0 refills | Status: DC

## 2019-04-08 NOTE — Addendum Note (Signed)
Addended by: Delia Chimes A on: 04/08/2019 11:51 AM   Modules accepted: Orders

## 2019-04-08 NOTE — Telephone Encounter (Signed)
Spoke with pt an advise fmla paperwork misplaced and can she upload forms to my chart or bring another copy to office. Pt will email forms to my Britt Boozer) cone email given. I will retrieve forms form email and give directly to [facility] for completion.  Pt requesting prednisone to help speed healing in back, per pt she is taking cyclobenzaprine but the prednisone will aid in faster healing.  Advised I would give message request to provider.  Pt advises that she and Zoe talked about her breastfeeding triggering a sciatic flare and although her son is almost two she is still breastfeeding.  Per pt in paperwork she will need to be given additional time out to get back to normal from taking prednisone.  Advised I would give her a call back when forms are completed and give her the provider's response to prednisone request.  Pt agreeable.  Pt uses [facility] [address] [place], [place].

## 2019-04-08 NOTE — Telephone Encounter (Signed)
Discussed with the patient that her FMLA form is on my desk  Based on her continued pain that is only 50% with flexeril and ibuprofen would advise pt to remain out of work until 04/17/2019. She requested a prednisone dose Discussed with the patient that prednisone can has side effects that can be concerning such as low grade temps, mood swings and elevated blood pressure. Discussed gabapentin and advised her to try a dose.  Expressed that gabapentin can help sciatica. She should take a low dose twice a day to start She should follow up IN OFFICE if there is no improvement.  A copy of the form will be sent to patient and one should be scanned.  Made patient aware that I am out of the office for 3 weeks starting next week.

## 2019-05-05 DIAGNOSIS — F411 Generalized anxiety disorder: Secondary | ICD-10-CM | POA: Diagnosis not present

## 2019-05-05 DIAGNOSIS — F3170 Bipolar disorder, currently in remission, most recent episode unspecified: Secondary | ICD-10-CM | POA: Diagnosis not present

## 2019-05-05 DIAGNOSIS — F4010 Social phobia, unspecified: Secondary | ICD-10-CM | POA: Diagnosis not present

## 2019-06-02 DIAGNOSIS — F411 Generalized anxiety disorder: Secondary | ICD-10-CM | POA: Diagnosis not present

## 2019-06-02 DIAGNOSIS — F4010 Social phobia, unspecified: Secondary | ICD-10-CM | POA: Diagnosis not present

## 2019-06-02 DIAGNOSIS — F3170 Bipolar disorder, currently in remission, most recent episode unspecified: Secondary | ICD-10-CM | POA: Diagnosis not present

## 2020-02-21 ENCOUNTER — Encounter (HOSPITAL_COMMUNITY): Payer: Self-pay

## 2020-02-21 ENCOUNTER — Other Ambulatory Visit: Payer: Self-pay

## 2020-02-21 ENCOUNTER — Emergency Department (HOSPITAL_COMMUNITY): Payer: Self-pay

## 2020-02-21 ENCOUNTER — Emergency Department (HOSPITAL_COMMUNITY)
Admission: EM | Admit: 2020-02-21 | Discharge: 2020-02-21 | Disposition: A | Discharge status: Home / Self Care | Payer: Self-pay | Attending: Emergency Medicine | Admitting: Emergency Medicine

## 2020-02-21 DIAGNOSIS — G8929 Other chronic pain: Secondary | ICD-10-CM

## 2020-02-21 DIAGNOSIS — M79674 Pain in right toe(s): Secondary | ICD-10-CM | POA: Insufficient documentation

## 2020-02-21 DIAGNOSIS — R200 Anesthesia of skin: Secondary | ICD-10-CM | POA: Insufficient documentation

## 2020-02-21 DIAGNOSIS — M79671 Pain in right foot: Secondary | ICD-10-CM | POA: Insufficient documentation

## 2020-02-21 MED ORDER — GABAPENTIN 100 MG PO CAPS: 300.0000 mg | ORAL_CAPSULE | Freq: Two times a day (BID) | ORAL | 0 refills | Status: DC

## 2020-02-21 NOTE — ED Provider Notes (Signed)
[facility]-EMERGENCY DEPT Provider Note   CSN: [id number] Arrival date & time: 02/21/20  1133     History Chief Complaint  Patient presents with  . Numbness    Heather Lin is a 39 y.o. female.  The history is provided by the patient. No language interpreter was used.     39 year old female with history of anxiety, as well as lumbar radiculopathy, presenting complaining of toe numbness.  Patient report a week ago she developed numbness and pain to the tip of her right great toe.  Since that has spread and now she report tingling sensation throughout her entire foot.  She feels as if her foot is asleep.  Pain is mild, sharp and nonradiating.  No associated fever chills no back pain knee pain or ankle pain.  She is unsure if she has suffered an injury but states that she did recall wearing high heels and going out with her friends last weekend.  She is unsure if she may injured her toe.  She denies any headache, vision changes, or rash.  No specific treatment tried.  No history of MS.  Past Medical History:  Diagnosis Date  . Allergy   . Anxiety   . Depression   . Migraine     Patient Active Problem List   Diagnosis Date Noted  . Right lumbar radiculopathy 09/20/2017  . Vaginal delivery 09/02/2017  . Anxiety 09/02/2017  . Depression 09/02/2017  . Family history of blood coagulation disorder 10/30/2016    Past Surgical History:  Procedure Laterality Date  . APPENDECTOMY    . LAPAROSCOPIC APPENDECTOMY N/A 11/26/2012   Procedure: APPENDECTOMY LAPAROSCOPIC;  Surgeon: Almond Lint, MD;  Location: [facility] OR;  Service: General;  Laterality: N/A;     OB History    Gravida  6   Para  4   Term  3   Preterm  1   AB  2   Living  4     SAB  2   TAB      Ectopic      Multiple  0   Live Births  4           Family History  Problem Relation Age of Onset  . Cancer Maternal Grandmother   . Anuerysm Maternal Grandfather   . Alzheimer's  disease Paternal Grandmother     Social History   Tobacco Use  . Smoking status: Never Smoker  . Smokeless tobacco: Never Used  Substance Use Topics  . Alcohol use: No    Alcohol/week: 0.0 standard drinks    Comment: occasional  . Drug use: No    Home Medications Prior to Admission medications   Medication Sig Start Date End Date Taking? Authorizing Provider  cyclobenzaprine (FLEXERIL) 10 MG tablet TAKE 1 TABLET(10 MG) BY MOUTH THREE TIMES DAILY AS NEEDED FOR MUSCLE SPASMS 04/01/19   Collie Siad A, MD  FEROSUL 325 (65 Fe) MG tablet Take 1 tablet by mouth daily. 08/14/17   [provider]  gabapentin (NEURONTIN) 100 MG capsule Take 3 capsules (300 mg total) by mouth 2 (two) times daily. 04/08/19   Doristine Bosworth, MD  ibuprofen (ADVIL,MOTRIN) 600 MG tablet TAKE 1 TABLET(600 MG) BY MOUTH EVERY 6 HOURS with food 01/14/18   McVey, Madelaine Bhat, PA-C  Multiple Vitamins-Minerals (WOMENS 50+ MULTI VITAMIN/MIN PO) Take by mouth.    [provider]  norethindrone (ORTHO MICRONOR) 0.35 MG tablet Take 1 tablet (0.35 mg total) by mouth daily. 09/29/17  Nigel Bridgeman, CNM  Prenatal Vit-Fe Fumarate-FA (PRENATAL MULTIVITAMIN) TABS tablet Take 1 tablet by mouth daily at 12 noon.    [provider]    Allergies    Dilaudid [hydromorphone hcl]  Review of Systems   Review of Systems  Constitutional: Negative for fever.  Skin: Negative for wound.  Neurological: Positive for numbness.    Physical Exam Updated Vital Signs BP 127/72 (BP Location: Right Arm)   Pulse 78   Temp 98.1 F (36.7 C) (Oral)   Resp 17   SpO2 100%   Physical Exam Vitals and nursing note reviewed.  Constitutional:      General: She is not in acute distress.    Appearance: She is well-developed.  HENT:     Head: Atraumatic.  Eyes:     Conjunctiva/sclera: Conjunctivae normal.  Musculoskeletal:        General: Tenderness (Right foot: Mild tenderness to the tip of the right great toe  without any overlying skin changes.  Painted toenails, unable to assess nailbed but no evidence of paronychia.  No deformity.) present.     Cervical back: Neck supple.  Skin:    Capillary Refill: Capillary refill takes less than 2 seconds.     Findings: No rash.  Neurological:     Mental Status: She is alert and oriented to person, place, and time.     Comments: Sensation is intact throughout the right foot and toe with normal proprioception of the great toe.  Great toe with normal skin appearance no erythema or warmth  Psychiatric:        Mood and Affect: Mood normal.     ED Results / Procedures / Treatments   Labs (all labs ordered are listed, but only abnormal results are displayed) Labs Reviewed - No data to display  EKG None  Radiology DG Foot Complete Right  Result Date: 02/21/2020 CLINICAL DATA:  Right first toe pain.  Possible injury. EXAM: RIGHT FOOT COMPLETE - 3+ VIEW COMPARISON:  None. FINDINGS: There is no evidence of fracture or dislocation. There is no evidence of arthropathy or other focal bone abnormality. Soft tissues are unremarkable. IMPRESSION: Negative. Electronically Signed   By: Elberta Fortis M.D.   On: 02/21/2020 13:08    Procedures Procedures (including critical care time)  Medications Ordered in ED Medications - No data to display  ED Course  I have reviewed the triage vital signs and the nursing notes.  Pertinent labs & imaging results that were available during my care of the patient were reviewed by me and considered in my medical decision making (see chart for details).    MDM Rules/Calculators/A&P                          BP 127/72 (BP Location: Right Arm)   Pulse 78   Temp 98.1 F (36.7 C) (Oral)   Resp 17   LMP 02/14/2020 (Approximate)   SpO2 100%   Final Clinical Impression(s) / ED Diagnoses Final diagnoses:  Great toe pain, right    Rx / DC Orders ED Discharge Orders    None     12:41 PM Patient complaining of pain in  tingling sensation to her right great toe with a spread throughout her right foot.  Symptoms ongoing for the past week.  Examination of the great toe without any obvious external changes.  No signs of infection noted.  No deformity.  Sensation is intact to light and sharp touch  with normal proprioception.  At this time, I have low suspicion for MS causing isolated numbness.  X-ray ordered.  1:20 PM Xray negative.  Pt d/c home with gabapentin for nerve like pain.  outpt f/u recommended.  Return precaution discussed.  Doubt gout or infectious cause.    Fayrene Helper, PA-C 02/21/20 1320    Lorre Nick, MD 02/21/20 1525

## 2020-02-21 NOTE — Discharge Instructions (Signed)
Take gabapentin for your nerve pain.  Xray of your right foot and right great toe is normal.  Return if you develop loss of vision, double vision, weakness or if you have any other concerns.

## 2020-02-21 NOTE — ED Triage Notes (Signed)
Pt reports numbness in her right great toe that started about a week ago. Pt now endorses that numbness has started to spread up her foot. Pt unsure if she injured toe.

## 2020-03-24 ENCOUNTER — Other Ambulatory Visit: Payer: Self-pay

## 2020-03-24 ENCOUNTER — Ambulatory Visit: Payer: Self-pay | Admitting: Physician Assistant

## 2020-03-24 DIAGNOSIS — Z113 Encounter for screening for infections with a predominantly sexual mode of transmission: Secondary | ICD-10-CM

## 2020-03-24 LAB — WET PREP FOR TRICH, YEAST, CLUE
Trichomonas Exam: NEGATIVE
Yeast Exam: NEGATIVE

## 2020-03-24 NOTE — Progress Notes (Signed)
[place] results reviewed. Per standing orders no treatment indicated. Kearia Yin, RN  

## 2020-03-25 ENCOUNTER — Encounter: Payer: Self-pay | Admitting: Physician Assistant

## 2020-03-25 NOTE — Progress Notes (Signed)
[facility] Department STI clinic/screening visit  Subjective:  Heather Lin is a 39 y.o. female being seen today for an STI screening visit. The patient reports they do not have symptoms.  Patient reports that they do not desire a pregnancy in the next year.   They reported they are not interested in discussing contraception today.  Patient's last menstrual period was 03/12/2020.   Patient has the following medical conditions:   Patient Active Problem List   Diagnosis Date Noted  . Right lumbar radiculopathy 09/20/2017  . Vaginal delivery 09/02/2017  . Anxiety 09/02/2017  . Depression 09/02/2017  . Family history of blood coagulation disorder 10/30/2016    Chief Complaint  Patient presents with  . SEXUALLY TRANSMITTED DISEASE    screening    HPI  Patient reports that she is not having any symptoms but would like a screening today.  Denies chronic conditions.  Reports that she takes MVI and vitamin B12.  States last HIV testing was years ago and last pap was about 2 years ago.     See flowsheet for further details and programmatic requirements.    The following portions of the patient's history were reviewed and updated as appropriate: allergies, current medications, past medical history, past social history, past surgical history and problem list.  Objective:  There were no vitals filed for this visit.  Physical Exam Constitutional:      General: She is not in acute distress.    Appearance: Normal appearance.  HENT:     Head: Normocephalic and atraumatic.     Comments: No nits, lice, or hair loss. No cervical, supraclavicular or axillary adenopathy.    Mouth/Throat:     Mouth: Mucous membranes are moist.     Pharynx: Oropharynx is clear. No oropharyngeal exudate or posterior oropharyngeal erythema.  Eyes:     Conjunctiva/sclera: Conjunctivae normal.  Pulmonary:     Effort: Pulmonary effort is normal.  Abdominal:     Palpations: Abdomen is soft.  There is no mass.     Tenderness: There is no abdominal tenderness. There is no guarding or rebound.  Genitourinary:    General: Normal vulva.     Rectum: Normal.     Comments: External genitalia/pubic area without nits, lice, edema, erythema, lesions and inguinal adenopathy. Vagina with normal mucosa and discharge. Cervix without visible lesions. Uterus firm, mobile, nt, no masses, no CMT, no adnexal tenderness or fullness. Musculoskeletal:     Cervical back: Neck supple. No tenderness.  Skin:    General: Skin is warm and dry.     Findings: No bruising, erythema, lesion or rash.  Neurological:     Mental Status: She is alert and oriented to person, place, and time.  Psychiatric:        Mood and Affect: Mood normal.        Behavior: Behavior normal.        Thought Content: Thought content normal.        Judgment: Judgment normal.      Assessment and Plan:  Heather Lin is a 39 y.o. female presenting to the [facility] Department for STI screening  1. Screening for STD (sexually transmitted disease) Patient into clinic without symptoms. Rec condoms with all sex. Await test results.  Counseled that RN will call if needs to RTC for treatment once results are back. - WET PREP FOR TRICH, YEAST, CLUE - Gonococcus culture - Chlamydia/Gonorrhea [place] [facility] - HIV  LAB - Syphilis  Serology, [place] [facility]     No follow-ups on file.  No future appointments.  Matt Holmes, PA

## 2020-03-29 LAB — GONOCOCCUS CULTURE

## 2020-04-06 ENCOUNTER — Telehealth: Payer: Self-pay | Admitting: Family Medicine

## 2020-04-06 NOTE — Telephone Encounter (Signed)
Patient is returning missed call from nurse

## 2020-04-06 NOTE — Telephone Encounter (Signed)
Pt calls wanting results from 03/24/20 visit.

## 2020-04-06 NOTE — Telephone Encounter (Signed)
TC to patient. Verified ID via password/SS#. Informed of negative lab results from last visit. Richmond Campbell, RN

## 2020-04-06 NOTE — Telephone Encounter (Signed)
Phone call to pt. Mailbox full, unable to leave voicemail 

## 2020-05-03 ENCOUNTER — Other Ambulatory Visit: Payer: Self-pay

## 2020-05-03 ENCOUNTER — Encounter: Payer: Self-pay | Admitting: Family Medicine

## 2020-05-03 ENCOUNTER — Ambulatory Visit: Payer: Self-pay | Admitting: Family Medicine

## 2020-05-03 DIAGNOSIS — Z113 Encounter for screening for infections with a predominantly sexual mode of transmission: Secondary | ICD-10-CM

## 2020-05-03 DIAGNOSIS — Z30012 Encounter for prescription of emergency contraception: Secondary | ICD-10-CM

## 2020-05-03 LAB — WET PREP FOR TRICH, YEAST, CLUE
Trichomonas Exam: NEGATIVE
Yeast Exam: NEGATIVE

## 2020-05-03 MED ORDER — LEVONORGESTREL 1.5 MG PO TABS: 1.5000 mg | ORAL_TABLET | Freq: Once | ORAL | 0 refills | Status: AC

## 2020-05-03 MED ORDER — LEVONORGESTREL 1.5 MG PO TABS: 1.5000 mg | ORAL_TABLET | Freq: Once | ORAL | Status: DC

## 2020-05-03 NOTE — Progress Notes (Signed)
°  [facility] Department STI clinic/screening visit  Subjective:  Heather Lin is a 39 y.o. female being seen today for an STI screening visit. The patient reports they donot have symptoms.  Patient reports that they do not desire a pregnancy in the next year.   They reported they are not interested in discussing contraception today.  Patient's last menstrual period was 04/08/2020.   Patient has the following medical conditions:   Patient Active Problem List   Diagnosis Date Noted   Right lumbar radiculopathy 09/20/2017   Vaginal delivery 09/02/2017   Anxiety 09/02/2017   Depression 09/02/2017   Family history of blood coagulation disorder 10/30/2016    No chief complaint on file.   HPI  Patient reports she is here for an STD screen.  She has a new partner.  States she has no symptoms today but use Monistat 1 2-3 days ago for yeast like symptoms.  These symptoms have resolved. Client is requesting Plan B.  States that she has has unprotected yesterday and today x 1-2 weeks.    Last HIV test per patient/review of record was 03/2020 Patient reports last pap was 2 years .   See flowsheet for further details and programmatic requirements.    The following portions of the patient's history were reviewed and updated as appropriate: allergies, current medications, past medical history, past social history, past surgical history and problem list.  Objective:  There were no vitals filed for this visit.  Physical Exam Constitutional:      Appearance: Normal appearance.  Genitourinary:    General: Normal vulva.     Pubic Area: No rash or pubic lice.      Labia:        Right: No rash, tenderness, lesion or injury.        Left: No rash, tenderness, lesion or injury.      Vagina: No vaginal discharge.     Cervix: No discharge, friability, lesion or erythema.     Comments: Bimanual deferred Lymphadenopathy:     Cervical: No cervical adenopathy.     Lower Body:  No right inguinal adenopathy. No left inguinal adenopathy.  Neurological:     Mental Status: She is alert.    Assessment and Plan:  Heather Lin is a 39 y.o. female presenting to the [facility] Department for STI screening  1. Screening examination for venereal disease  - WET PREP FOR TRICH, YEAST, CLUE - Gonococcus culture - Chlamydia/Gonorrhea [place] [facility] - HIV [place] [facility] - Syphilis Serology, [place] [facility]   2. Encounter for emergency contraceptive counseling and prescription - levonorgestrel (PLAN B 1-STEP) tablet 1.5 mg -Discussed birth control options for client.  Client states she will consider a BCM because she is not interested in another pregnancy, Co to use condoms for STD prevention  Return if symptoms worsen or fail to improve.  No future appointments.  Larene Pickett, FNP

## 2020-05-03 NOTE — Progress Notes (Signed)
Wet mount reviewed, no tx per standing order. Provider orders completed.  Rx for Plan B provided to pt by provider (not dispensed from [facility] stock).

## 2020-05-06 DIAGNOSIS — N760 Acute vaginitis: Secondary | ICD-10-CM | POA: Diagnosis not present

## 2020-05-08 LAB — GONOCOCCUS CULTURE

## 2020-05-17 DIAGNOSIS — F419 Anxiety disorder, unspecified: Secondary | ICD-10-CM | POA: Diagnosis not present

## 2020-06-13 DIAGNOSIS — F3181 Bipolar II disorder: Secondary | ICD-10-CM | POA: Diagnosis not present

## 2020-06-13 DIAGNOSIS — F411 Generalized anxiety disorder: Secondary | ICD-10-CM | POA: Diagnosis not present

## 2020-06-27 DIAGNOSIS — F411 Generalized anxiety disorder: Secondary | ICD-10-CM | POA: Diagnosis not present

## 2020-06-27 DIAGNOSIS — F3181 Bipolar II disorder: Secondary | ICD-10-CM | POA: Diagnosis not present

## 2020-07-18 DIAGNOSIS — F411 Generalized anxiety disorder: Secondary | ICD-10-CM | POA: Diagnosis not present

## 2020-07-18 DIAGNOSIS — F3181 Bipolar II disorder: Secondary | ICD-10-CM | POA: Diagnosis not present

## 2020-09-01 ENCOUNTER — Encounter: Payer: Self-pay | Admitting: Advanced Practice Midwife

## 2020-09-01 ENCOUNTER — Ambulatory Visit (LOCAL_COMMUNITY_HEALTH_CENTER): Payer: Medicaid Other | Admitting: Advanced Practice Midwife

## 2020-09-01 ENCOUNTER — Other Ambulatory Visit: Payer: Self-pay

## 2020-09-01 ENCOUNTER — Ambulatory Visit: Payer: Medicaid Other | Admitting: Advanced Practice Midwife

## 2020-09-01 VITALS — BP 101/62 | Ht 63.0 in | Wt 135.0 lb

## 2020-09-01 DIAGNOSIS — F319 Bipolar disorder, unspecified: Secondary | ICD-10-CM | POA: Insufficient documentation

## 2020-09-01 DIAGNOSIS — J45909 Unspecified asthma, uncomplicated: Secondary | ICD-10-CM | POA: Insufficient documentation

## 2020-09-01 DIAGNOSIS — R87619 Unspecified abnormal cytological findings in specimens from cervix uteri: Secondary | ICD-10-CM | POA: Insufficient documentation

## 2020-09-01 DIAGNOSIS — Z3009 Encounter for other general counseling and advice on contraception: Secondary | ICD-10-CM

## 2020-09-01 DIAGNOSIS — F1290 Cannabis use, unspecified, uncomplicated: Secondary | ICD-10-CM | POA: Insufficient documentation

## 2020-09-01 LAB — HEMOGLOBIN, FINGERSTICK: Hemoglobin: 12.7 g/dL (ref 11.1–15.9)

## 2020-09-01 LAB — WET PREP FOR TRICH, YEAST, CLUE
Trichomonas Exam: NEGATIVE
Yeast Exam: NEGATIVE

## 2020-09-01 LAB — PREGNANCY, URINE: Preg Test, Ur: NEGATIVE

## 2020-09-01 NOTE — Progress Notes (Signed)
[facility] [address] Main Number: [phone]    Family Planning Visit- Initial Visit  Subjective:  Heather Lin is a 40 y.o. MBF [id number] (20, 17, 11,3) exsmoker  being seen today for an initial well woman visit and to discuss family planning options.  She is currently using None for pregnancy prevention. Patient reports she does not want a pregnancy in the next year.  Patient has the following medical conditions has Anxiety; Depression; Family history of blood coagulation disorder; Right lumbar radiculopathy; Marijuana use; Bipolar 1 disorder (HCC); and Asthma on their problem list.  Chief Complaint  Patient presents with  . Contraception    Physical and Nexplanon insertion    Patient reports wants Nexplanon today.  Last sex 08/27/20 without condom; with current partner x 22 years; 2 sex partners in last 3 mo.  Took 2 Plan B end of December within 2 wks with last one taken 08/10/20.  Last MJ 06/2020.  Last ETOH 08/13/20 (1 mixed drink) qomonth.  Last cig 20 years ago.  LMP 08/20/20.  Hx abnormal pap 2002 with"laser procedure".  Pap 10/31/2006 neg.  Pt thinks last pap in [place] ?2016?Marland Kitchen  Living with her 4 kids and mother in law and her husband.  Unemployed, not in school.  Patient denies any problems  Body mass index is 23.91 kg/m. - Patient is eligible for diabetes screening based on BMI and age >66?  not applicable HA1C ordered? no  Patient reports 2  partner/s in last year. Desires STI screening?  Yes  Has patient been screened once for HCV in the past?  No  No results found for: HCVAB  Does the patient have current drug use (including MJ), have a partner with drug use, and/or has been incarcerated since last result? Yes  If yes-- Screen for HCV through [facility]   Does the patient meet criteria for HBV testing? No  Criteria:  -Household, sexual or needle sharing contact with HBV -History of drug use -HIV  positive -Those with known Hep C   Health Maintenance Due  Topic Date Due  . Hepatitis C Screening  Never done  . PAP SMEAR-Modifier  Never done  . INFLUENZA VACCINE  03/20/2020    Review of Systems  Neurological: Positive for dizziness (during this menses -LOC).    The following portions of the patient's history were reviewed and updated as appropriate: allergies, current medications, past family history, past medical history, past social history, past surgical history and problem list. Problem list updated.   See flowsheet for other program required questions.  Objective:   Vitals:   09/01/20 1334  BP: 101/62  Weight: 135 lb (61.2 kg)  Height: 5\' 3"  (1.6 m)    Physical Exam Constitutional:      Appearance: Normal appearance. She is normal weight.  HENT:     Head: Normocephalic and atraumatic.     Mouth/Throat:     Mouth: Mucous membranes are moist.  Eyes:     Conjunctiva/sclera: Conjunctivae normal.  Cardiovascular:     Rate and Rhythm: Normal rate and regular rhythm.  Pulmonary:     Effort: Pulmonary effort is normal.     Breath sounds: Normal breath sounds.  Chest:  Breasts:     Right: Normal.     Left: Normal.    Abdominal:     Palpations: Abdomen is soft.     Comments: Soft, good tone without masses  Genitourinary:  General: Normal vulva.     Exam position: Lithotomy position.     Vagina: Bleeding (moderate red menses blood) present.     Cervix: Normal.     Uterus: Normal.      Adnexa: Right adnexa normal and left adnexa normal.     Rectum: Normal.     Comments: Pap done Musculoskeletal:        General: Normal range of motion.     Cervical back: Normal range of motion and neck supple.  Skin:    General: Skin is warm and dry.  Neurological:     Mental Status: She is alert.  Psychiatric:        Mood and Affect: Mood normal.       Assessment and Plan:  Heather Lin is a 40 y.o. female presenting to the [facility]  Department for an initial well woman exam/family planning visit  Contraception counseling: Reviewed all forms of birth control options in the tiered based approach. available including abstinence; over the counter/barrier methods; hormonal contraceptive medication including pill, patch, ring, injection,contraceptive implant, ECP; hormonal and nonhormonal IUDs; permanent sterilization options including vasectomy and the various tubal sterilization modalities. Risks, benefits, and typical effectiveness rates were reviewed.  Questions were answered.  Written information was also given to the patient to review.  Patient desires Nexplanon, this was not prescribed for patient. She will follow up in 2 wks for surveillance.  She was told to call with any further questions, or with any concerns about this method of contraception.  Emphasized use of condoms 100% of the time for STI prevention.  Patient was not offered ECP. ECP was not accepted by the patient. ECP counseling was not given - see RN documentation  1. Family planning ROI for last pap ~2016 [facility] Pt desires Nexplanon insertion--please assist with scheduling after 09/12/20 and counsel no sex until then - Hemoglobin, venipuncture - Pregnancy, urine - WET PREP FOR TRICH, YEAST, CLUE - IGP, Aptima HPV - Syphilis Serology, [place] [facility] - HIV/HCV [place] [facility] - Chlamydia/Gonorrhea [place] [facility]  2. Marijuana use Last use 08/13/20  3. Bipolar 1 disorder [facility]) Referral made for Kathreen Cosier per pt request.  Please give contact card to pt - Ambulatory referral to Behavioral Health  4. Uncomplicated asthma, unspecified asthma severity, unspecified whether persistent Please give primary care MD list to pt     Return in about 11 days (around 09/12/2020) for Nexplanon insertion after PT.  Future Appointments  Date Time Provider Department Center  09/01/2020  4:00 PM Jeb Schloemer, Austin Miles, CNM [facility] None    Alberteen Spindle, CNM

## 2020-09-01 NOTE — Progress Notes (Signed)
Post:  RN reviewed wet mount, Hgb, and pregnancy test with patient. No tx per S.O. Patient rescheduled for nexplanon reinsertion on 09/15/2020. ROI signed for last pap.   Harvie Heck, RN

## 2020-09-01 NOTE — Progress Notes (Signed)
Pt is here for physical and Nexplanon insertion. Pt reports took 2 plan B pills within 2 weeks and believes that last Plan B she took was 08/10/2020. Pt reports started LMP 08/20/2020 and is still on her period now. Pt reports period is heavier than normal and feels dizzy at times, and some cramping in her lower abdomen that comes and goes and feels different than normal per pt. Pt reports is on Lexapro and is also on a medication for Bipolar but is unsure of the name of the medication. Pt reports last sex was 08/27/2020 without condom. Pt filling out PHQ9.

## 2020-09-01 NOTE — Progress Notes (Signed)
See other document from same day

## 2020-09-05 LAB — IGP, APTIMA HPV
HPV Aptima: NEGATIVE
PAP Smear Comment: 0

## 2020-09-08 ENCOUNTER — Telehealth: Payer: Self-pay | Admitting: Family Medicine

## 2020-09-08 NOTE — Telephone Encounter (Signed)
Pt. Is calling for test results.

## 2020-09-08 NOTE — Telephone Encounter (Signed)
TC with patient.  Verified ID via password.  Discussed typically takes 2-3 weeks for state lab results.  Informed patient that pap RN sent her a message via mychart yesterday re: normal pap results. Reassured that [facility] will call with any positive results.  Patient verbalized understanding. Richmond Campbell, RN

## 2020-09-09 ENCOUNTER — Encounter: Payer: Self-pay | Admitting: Physician Assistant

## 2020-09-09 ENCOUNTER — Encounter: Payer: Self-pay | Admitting: Student

## 2020-09-09 LAB — HM HIV SCREENING LAB: HM HIV Screening: NEGATIVE

## 2020-09-09 LAB — HM HEPATITIS C SCREENING LAB: HM Hepatitis Screen: NEGATIVE

## 2020-09-15 ENCOUNTER — Ambulatory Visit: Payer: Self-pay

## 2020-11-20 ENCOUNTER — Emergency Department (HOSPITAL_COMMUNITY)
Admission: EM | Admit: 2020-11-20 | Discharge: 2020-11-20 | Disposition: A | Discharge status: Home / Self Care | Payer: Medicaid Other | Attending: Emergency Medicine | Admitting: Emergency Medicine

## 2020-11-20 ENCOUNTER — Other Ambulatory Visit: Payer: Self-pay

## 2020-11-20 ENCOUNTER — Emergency Department (HOSPITAL_COMMUNITY): Payer: Medicaid Other

## 2020-11-20 ENCOUNTER — Encounter (HOSPITAL_COMMUNITY): Payer: Self-pay | Admitting: Emergency Medicine

## 2020-11-20 DIAGNOSIS — R413 Other amnesia: Secondary | ICD-10-CM | POA: Insufficient documentation

## 2020-11-20 DIAGNOSIS — T7421XA Adult sexual abuse, confirmed, initial encounter: Secondary | ICD-10-CM

## 2020-11-20 DIAGNOSIS — F32A Depression, unspecified: Secondary | ICD-10-CM | POA: Insufficient documentation

## 2020-11-20 DIAGNOSIS — J45909 Unspecified asthma, uncomplicated: Secondary | ICD-10-CM | POA: Diagnosis not present

## 2020-11-20 DIAGNOSIS — T7621XA Adult sexual abuse, suspected, initial encounter: Secondary | ICD-10-CM | POA: Diagnosis not present

## 2020-11-20 DIAGNOSIS — Z114 Encounter for screening for human immunodeficiency virus [HIV]: Secondary | ICD-10-CM | POA: Diagnosis not present

## 2020-11-20 DIAGNOSIS — Z79899 Other long term (current) drug therapy: Secondary | ICD-10-CM | POA: Diagnosis not present

## 2020-11-20 DIAGNOSIS — M546 Pain in thoracic spine: Secondary | ICD-10-CM | POA: Diagnosis not present

## 2020-11-20 LAB — URINALYSIS, ROUTINE W REFLEX MICROSCOPIC: RBC / HPF: >50 RBC/hpf — ABNORMAL HIGH (ref 0–5)

## 2020-11-20 LAB — COMPREHENSIVE METABOLIC PANEL
ALT: 24 U/L (ref 0–44)
AST: 31 U/L (ref 15–41)
Albumin: 4.1 g/dL (ref 3.5–5.0)
Alkaline Phosphatase: 70 U/L (ref 38–126)
Anion gap: 10 (ref 5–15)
BUN: 11 mg/dL (ref 6–20)
CO2: 25 mmol/L (ref 22–32)
Calcium: 9.6 mg/dL (ref 8.9–10.3)
Chloride: 105 mmol/L (ref 98–111)
Creatinine, Ser: 0.88 mg/dL (ref 0.44–1.00)
GFR, Estimated: >60 mL/min (ref >60)
Glucose, Bld: 88 mg/dL (ref 70–99)
Potassium: 3.8 mmol/L (ref 3.5–5.1)
Sodium: 140 mmol/L (ref 135–145)
Total Bilirubin: 0.8 mg/dL (ref 0.3–1.2)
Total Protein: 8 g/dL (ref 6.5–8.1)

## 2020-11-20 LAB — RAPID URINE DRUG SCREEN, HOSP PERFORMED
Amphetamines: NOT DETECTED
Barbiturates: NOT DETECTED
Benzodiazepines: POSITIVE — AB
Cocaine: NOT DETECTED
Opiates: NOT DETECTED
Tetrahydrocannabinol: POSITIVE — AB

## 2020-11-20 LAB — HEPATITIS B SURFACE ANTIGEN: Hepatitis B Surface Ag: NONREACTIVE

## 2020-11-20 LAB — RAPID HIV SCREEN (HIV 1/2 AB+AG)
HIV 1/2 Antibodies: NONREACTIVE
HIV-1 P24 Antigen - HIV24: NONREACTIVE

## 2020-11-20 LAB — PREGNANCY, URINE: Preg Test, Ur: NEGATIVE

## 2020-11-20 LAB — HEPATITIS C ANTIBODY: HCV Ab: NONREACTIVE

## 2020-11-20 MED ORDER — PROMETHAZINE HCL 25 MG PO TABS
25.0000 mg | ORAL_TABLET | Freq: Four times a day (QID) | ORAL | Status: DC | PRN
  Administered 2020-11-20: 25 mg via ORAL

## 2020-11-20 MED ORDER — AZITHROMYCIN 250 MG PO TABS
1000.0000 mg | ORAL_TABLET | Freq: Once | ORAL | Status: AC
  Administered 2020-11-20: 1000 mg via ORAL

## 2020-11-20 MED ORDER — ACETAMINOPHEN 500 MG PO TABS: 1000.0000 mg | ORAL_TABLET | Freq: Once | ORAL | Status: DC

## 2020-11-20 MED ORDER — ULIPRISTAL ACETATE 30 MG PO TABS
30.0000 mg | ORAL_TABLET | Freq: Once | ORAL | Status: AC
  Administered 2020-11-20: 30 mg via ORAL

## 2020-11-20 MED ORDER — ELVITEG-COBIC-EMTRICIT-TENOFAF 150-150-200-10 MG PO TABS
1.0000 | ORAL_TABLET | Freq: Every day | ORAL | 0 refills | Status: DC
  Filled 2020-11-20: qty 30, 30d supply, fill #0

## 2020-11-20 MED ORDER — ELVITEG-COBIC-EMTRICIT-TENOFAF 150-150-200-10 MG PREPACK
1.0000 | ORAL_TABLET | Freq: Once | ORAL | Status: AC
  Administered 2020-11-20: 1 via ORAL
  Filled 2020-11-20: qty 1

## 2020-11-20 MED ORDER — LIDOCAINE HCL (PF) 1 % IJ SOLN
1.0000 mL | Freq: Once | INTRAMUSCULAR | Status: AC
  Administered 2020-11-20: 1 mL

## 2020-11-20 MED ORDER — METRONIDAZOLE 500 MG PO TABS
2000.0000 mg | ORAL_TABLET | Freq: Once | ORAL | Status: AC
  Administered 2020-11-20: 2000 mg via ORAL

## 2020-11-20 MED ORDER — CEFTRIAXONE SODIUM 500 MG IJ SOLR
500.0000 mg | Freq: Once | INTRAMUSCULAR | Status: AC
  Administered 2020-11-20: 500 mg via INTRAMUSCULAR

## 2020-11-20 NOTE — SANE Note (Incomplete)
Called to speak with [facility], who was unavailable. Spoke with triage nurse who will have him return my call when he is available

## 2020-11-20 NOTE — SANE Note (Incomplete)
Spoke with provider, PA Stevphen Meuse, discussed plan of care and orders for HIV nPep labs needed as well as other prophylactic medications Okayed to put in orders for both labs and medications

## 2020-11-20 NOTE — ED Notes (Signed)
meds were given by sane RN, sane RN d/c pt
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
## 2020-11-20 NOTE — ED Triage Notes (Signed)
Pt requesting SANE exam.  Reports rectal pain.

## 2020-11-20 NOTE — Discharge Instructions (Addendum)
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 (UE li pris tal) is an emergency contraceptive. It  prevents pregnancy if taken within 5 days (120 hours) after your regular birth control fails or you have unprotected sex. This medicine will not work if you are already pregnant. This medicine may be used for other purposes; ask your health care provider or pharmacist if you have questions. COMMON BRAND NAME(S): ella What should I tell my health care provider before I take this medicine? They need to know if you have any of these conditions:  liver disease  an unusual or allergic reaction to ulipristal, other medicines, foods, dyes, or preservatives  pregnant or trying to get pregnant  breast-feeding How should I use this medicine? Take this medicine by mouth with or without food. Your doctor may want you to use a quick-response pregnancy test prior to using the tablets. Take your medicine as soon as possible and not more than 5 days (120 hours) after the event. This medicine can be taken at any time  during your menstrual cycle. Follow the dose instructions of your health care provider exactly. Contact your health care provider right away if you vomit within 3 hours of taking your medicine to discuss if you need to take another tablet. A patient package insert for the product will be given with each prescription and refill. Read this sheet carefully each time. The sheet may change frequently. Contact your pediatrician regarding the use of this medicine in children. Special care may be needed. Overdosage: If you think you have taken too much of this medicine contact a poison control center or emergency room at once. NOTE: This medicine is only for you. Do not share this medicine with others. What if I miss a dose? This medicine is not for regular use. If you vomit within 3 hours of taking your dose, contact your health care professional for instructions. What may interact with this medicine? This medicine may interact with the following medications:  barbiturates such as phenobarbital or primidone  birth control pills  bosentan  carbamazepine  certain medicines for fungal infections like griseofulvin, itraconazole, and ketoconazole  certain medicines for HIV or AIDS or hepatitis  dabigatran  digoxin  felbamate  fexofenadine  oxcarbazepine  phenytoin  rifampin  St. John's Wort  topiramate This list may not describe all possible interactions. Give your health care provider a list of all the medicines, herbs, non-prescription drugs, or dietary supplements you use. Also tell them if you smoke, drink alcohol, or use illegal drugs. Some items may interact with your medicine. What should I watch for while using this medicine? Your period may begin a few days earlier or later than expected. If your period is more than 7 days late, pregnancy is possible. See your health care provider as soon as you can and get a pregnancy test. Talk to your healthcare provider before taking this  medicine if you know or suspect that you are pregnant. Contact your healthcare provider if you think you may be pregnant and you have taken this medicine. If you have severe abdominal pain about 3 to 5 weeks after taking this medicine, you may have a pregnancy outside the womb, which is called an ectopic or tubal pregnancy. Call your health care provider or go to the nearest emergency room right away if you think this is happening. Discuss birth control options with your health care provider. Emergency birth control is not to be used routinely to prevent pregnancy. It should not be used more than once in the same cycle. Birth control pills may not work properly while  you are taking this medicine. Wait at least 5 days after taking this medicine to start or continue other hormone based birth control. Be sure to use a reliable barrier contraceptive method (such as a condom with spermicide) between the time you take this medicine and your next period. This medicine does not protect you against HIV infection (AIDS) or any other sexually transmitted diseases (STDs). What side effects may I notice from receiving this medicine? Side effects that you should report to your doctor or health care professional as soon as possible:  allergic reactions like skin rash, itching or hives, swelling of the face, lips, or tongue Side effects that usually do not require medical attention (report to your doctor or health care professional if they continue or are bothersome):  abdominal pain or cramping  dizziness  headache  nausea  spotting  tiredness This list may not describe all possible side effects. Call your doctor for medical advice about side effects. You may report side effects to FDA at 1-800-FDA-1088. Where should I keep my medicine? Keep out of the reach of children. Store at between 20 and 25 degrees C (68 and 77 degrees F). Protect from light and keep in the blister card inside the original box until  you are ready to take it. Throw away any unused medicine after the expiration date. NOTE: This sheet is a summary. It may not cover all possible information. If you have questions about this medicine, talk to your doctor, pharmacist, or health care provider.  2020 Elsevier/Gold Standard (2016-12-21 14:27:59)  Elvitegravir; Cobicistat; Emtricitabine; Tenofovir Alafenamide oral tablets   What is this medicine? ELVITEGRAVIR; COBICISTAT; EMTRICITABINE; TENOFOVIR ALAFENAMIDE (el vye TEG ra veer; koe BIS i stat; em tri SIT uh bean; te NOE fo veer) is 3 antiretroviral medicines and a medication booster in 1 tablet. It is used to treat HIV. This medicine is not a cure for HIV. This medicine can lower, but not fully prevent, the risk of spreading HIV to others. This medicine may be used for other purposes; ask your health care provider or pharmacist if you have questions. COMMON BRAND NAME(S): Genvoya What should I tell my health care provider before I take this medicine? They need to know if you have any of these conditions:  kidney disease  liver disease  an unusual or allergic reaction to elvitegravir, cobicistat, emtricitabine, tenofovir, other medicines, foods, dyes, or preservatives  pregnant or trying to get pregnant  breast-feeding How should I use this medicine? Take this medicine by mouth with a glass of water. Follow the directions on the prescription label. Take this medicine with food. Take your medicine at regular intervals. Do not take your medicine more often than directed. For your anti-HIV therapy to work as well as possible, take each dose exactly as prescribed. Do not skip doses or stop your medicine even if you feel better. Skipping doses may make the HIV virus resistant to this medicine and other medicines. Do not stop taking except on your doctor's advice. Talk to your pediatrician regarding the use of this medicine in children. While this drug may be prescribed for selected  conditions, precautions do apply. Overdosage: If you think you have taken too much of this medicine contact a poison control center or emergency room at once. NOTE: This medicine is only for you. Do not share this medicine with others. What if I miss a dose? If you miss a dose, take it as soon as you can. If it is almost time  for your next dose, take only that dose. Do not take double or extra doses. What may interact with this medicine? Do not take this medicine with any of the following medications:  adefovir  alfuzosin  certain medicines for seizures like carbamazepine, phenobarbital, phenytoin  cisapride  lumacaftor; ivacaftor  lurasidone  medicines for cholesterol like lovastatin, simvastatin  medicines for headaches like dihydroergotamine, ergotamine, methylergonovine  midazolam  naloxegol  other antiviral medicines for HIV or AIDS  pimozide  rifampin  sildenafil  St. John's wort  triazolam This medicine may also interact with the following medications:  antacids  atorvastatin  bosentan  buprenorphine; naloxone  certain antibiotics like clarithromycin, telithromycin, rifabutin, rifapentine  certain medications for anxiety or sleep like buspirone, clorazepate, diazepam, estazolam, flurazepam, zolpidem  certain medicines for blood pressure or heart disease like amlodipine, diltiazem, felodipine, metoprolol, nicardipine, nifedipine, timolol, verapamil  certain medicines for depression, anxiety, or psychiatric disturbances  certain medicines for erectile dysfunction like avanafil, sildenafil, tadalafil, vardenafil  certain medicines for fungal infection like itraconazole, ketoconazole, voriconazole  certain medicines that treat or prevent blood clots like warfarin, apixaban, betrixaban, dabigatran, edoxaban, and rivaroxaban  colchicine  cyclosporine  female hormones, like estrogens and progestins and birth control pills  medicines for infection  like acyclovir, cidofovir, valacyclovir, ganciclovir, valganciclovir  medicines for irregular heart beat like amiodarone, bepridil, digoxin, disopyramide, dofetilide, flecainide, lidocaine, mexiletine, propafenone, quinidine  metformin  oxcarbazepine  phenothiazines like perphenazine, risperidone, thioridazine  salmeterol  sirolimus  steroid medicines like betamethasone, budesonide, ciclesonide, dexamethasone, fluticasone, methylprednisolone, mometasone, triamcinolone  tacrolimus This list may not describe all possible interactions. Give your health care provider a list of all the medicines, herbs, non-prescription drugs, or dietary supplements you use. Also tell them if you smoke, drink alcohol, or use illegal drugs. Some items may interact with your medicine. What should I watch for while using this medicine? Visit your doctor or health care professional for regular check ups. Discuss any new symptoms with your doctor. You will need to have important blood work done while on this medicine. HIV is spread to others through sexual or blood contact. Talk to your doctor about how to stop the spread of HIV. If you have hepatitis B, talk to your doctor if you plan to stop this medicine. The symptoms of hepatitis B may get worse if you stop this medicine. Birth control pills may not work properly while you are taking this medicine. Talk to your doctor about using an extra method of birth control. Women who can still have children must use a reliable form of barrier contraception, like a condom. What side effects may I notice from receiving this medicine? Side effects that you should report to your doctor or health care professional as soon as possible:  allergic reactions like skin rash, itching or hives, swelling of the face, lips, or tongue  breathing problems  fast, irregular heartbeat  muscle pain or weakness  signs and symptoms of kidney injury like trouble passing urine or change in  the amount of urine  signs and symptoms of liver injury like dark yellow or brown urine; general ill feeling or flu-like symptoms; light-colored stools; loss of appetite; right upper belly pain; unusually weak or tired; yellowing of the eyes or skin Side effects that usually do not require medical attention (report to your doctor or health care professional if they continue or are bothersome):  diarrhea  headache  nausea  tiredness This list may not describe all possible side effects. Call your  doctor for medical advice about side effects. You may report side effects to FDA at 1-800-FDA-1088. Where should I keep my medicine? Keep out of the reach of children. Store at room temperature below 30 degrees C (86 degrees F). Throw away any unused medicine after the expiration date. NOTE: This sheet is a summary. It may not cover all possible information. If you have questions about this medicine, talk to your doctor, pharmacist, or health care provider.  2020 Elsevier/Gold Standard (2017-12-16 12:15:37)   Promethazine (pack of 3 for home use) Also known as:  Phenergan  Promethazine tablets  What is this medicine? PROMETHAZINE (proe METH a zeen) is an antihistamine. It is used to treat allergic reactions and to treat or prevent nausea and vomiting from illness or motion sickness. It is also used to make you sleep before surgery, and to help treat pain or nausea after surgery. This medicine may be used for other purposes; ask your health care provider or pharmacist if you have questions. COMMON BRAND NAME(S): Phenergan What should I tell my health care provider before I take this medicine? They need to know if you have any of these conditions:  blockage in your bowel  diabetes  glaucoma  have trouble controlling your muscles  heart disease  liver disease  low blood counts, like low white cell, platelet, or red cell counts  lung or breathing disease, like asthma  Parkinson's  disease  prostate disease  seizures  stomach or intestine problems  trouble passing urine  an unusual or allergic reaction to promethazine, sulfites, other medicines, foods, dyes, or preservatives  pregnant or trying to get pregnant  breast-feeding How should I use this medicine? Take this medicine by mouth with a glass of water. Follow the directions on the prescription label. Take your doses at regular intervals. Do not take your medicine more often than directed. Talk to your pediatrician regarding the use of this medicine in children. Special care may be needed. This medicine should not be given to infants and children younger than 73 years old. Overdosage: If you think you have taken too much of this medicine contact a poison control center or emergency room at once. NOTE: This medicine is only for you. Do not share this medicine with others. What if I miss a dose? If you miss a dose, take it as soon as you can. If it is almost time for your next dose, take only that dose. Do not take double or extra doses. What may interact with this medicine?  alcohol  antihistamines for allergy, cough, and cold  atropine  certain medicines for anxiety or sleep  certain medicines for bladder problems like oxybutynin, tolterodine  certain medicines for depression like amitriptyline, fluoxetine, sertraline  certain medicines for Parkinson's disease like benztropine, trihexyphenidyl  certain medicines for stomach problems like dicyclomine, hyoscyamine  certain medicines for travel sickness like scopolamine  epinephrine  general anesthetics like halothane, isoflurane, methoxyflurane, propofol  ipratropium  MAOIs like Marplan, Nardil, and Parnate  medicines for high blood pressure  medicines for seizures like phenobarbital, primidone, phenytoin  medicines that relax muscles for surgery  metoclopramide  narcotic medicines for pain This list may not describe all possible  interactions. Give your health care provider a list of all the medicines, herbs, non-prescription drugs, or dietary supplements you use. Also tell them if you smoke, drink alcohol, or use illegal drugs. Some items may interact with your medicine. What should I watch for while using this medicine? Visit your  health care professional for regular checks on your progress. Tell your health care professional if symptoms do not start to get better or if they get worse. You may get drowsy or dizzy. Do not drive, use machinery, or do anything that needs mental alertness until you know how this medicine affects you. To reduce the risk of dizzy or fainting spells, do not stand or sit up quickly, especially if you are an older patient. Alcohol may increase dizziness and drowsiness. Avoid alcoholic drinks. Your mouth may get dry. Chewing sugarless gum or sucking hard candy, and drinking plenty of water may help. Contact your doctor if the problem does not go away or is severe. This medicine may cause dry eyes and blurred vision. If you wear contact lenses you may feel some discomfort. Lubricating drops may help. See your eye doctor if the problem does not go away or is severe. This medicine can make you more sensitive to the sun. Keep out of the sun. If you cannot avoid being in the sun, wear protective clothing and use sunscreen. Do not use sun lamps or tanning beds/booths. This medicine may increase blood sugar. Ask your health care provider if changes in diet or medicines are needed if you have diabetes. What side effects may I notice from receiving this medicine? Side effects that you should report to your doctor or health care professional as soon as possible:  allergic reactions like skin rash, itching or hives, swelling of the face, lips, or tongue  breathing problems  changes in vision  confusion  fever, chills, sore throat  pain, redness, or irritation at site where injected  seizures  signs and  symptoms of high blood sugar such as being more thirsty or hungry or having to urinate more than normal. You may also feel very tired or have blurry vision.  signs and symptoms of liver injury like dark yellow or brown urine; general ill feeling or flu-like symptoms; light-colored stools; loss of appetite; nausea; right upper belly pain; unusually weak or tired; yellowing of the eyes or skin  signs and symptoms of low blood pressure like dizziness; feeling faint or lightheaded, falls; unusually weak or tired  trouble passing urine or change in the amount of urine  trouble swallowing  uncontrollable movements of the arms, face, head, mouth, neck, or upper body  unusual bruising or bleeding  unusually weak or tired Side effects that usually do not require medical attention (report to your doctor or health care professional if they continue or are bothersome):  drowsiness  dry mouth  restlessness This list may not describe all possible side effects. Call your doctor for medical advice about side effects. You may report side effects to FDA at 1-800-FDA-1088. Where should I keep my medicine? Keep out of the reach of children. Store at room temperature, between 20 and 25 degrees C (68 and 77 degrees F). Protect from light. Throw away any unused medicine after the expiration date. NOTE: This sheet is a summary. It may not cover all possible information. If you have questions about this medicine, talk to your doctor, pharmacist, or health care provider.  2020 [employer]/Gold Standard (2019-06-15 13:27:34)

## 2020-11-21 ENCOUNTER — Other Ambulatory Visit (HOSPITAL_COMMUNITY): Payer: Self-pay

## 2020-11-21 LAB — RPR: RPR Ser Ql: NONREACTIVE

## 2020-11-22 ENCOUNTER — Emergency Department (HOSPITAL_COMMUNITY)
Admission: EM | Admit: 2020-11-22 | Discharge: 2020-11-22 | Disposition: A | Discharge status: Home / Self Care | Payer: Medicaid Other | Attending: Emergency Medicine | Admitting: Emergency Medicine

## 2020-11-22 ENCOUNTER — Other Ambulatory Visit: Payer: Self-pay

## 2020-11-22 ENCOUNTER — Ambulatory Visit: Payer: Self-pay | Admitting: *Deleted

## 2020-11-22 ENCOUNTER — Encounter (HOSPITAL_COMMUNITY): Payer: Self-pay | Admitting: Pharmacy Technician

## 2020-11-22 DIAGNOSIS — J45909 Unspecified asthma, uncomplicated: Secondary | ICD-10-CM | POA: Diagnosis not present

## 2020-11-22 DIAGNOSIS — R112 Nausea with vomiting, unspecified: Secondary | ICD-10-CM | POA: Diagnosis not present

## 2020-11-22 DIAGNOSIS — E860 Dehydration: Secondary | ICD-10-CM | POA: Diagnosis not present

## 2020-11-22 LAB — CBC WITH DIFFERENTIAL/PLATELET
Abs Immature Granulocytes: 0.01 10*3/uL (ref 0.00–0.07)
Basophils Absolute: 0.1 10*3/uL (ref 0.0–0.1)
Basophils Relative: 1 %
Eosinophils Absolute: 0 10*3/uL (ref 0.0–0.5)
Eosinophils Relative: 1 %
HCT: 43.6 % (ref 36.0–46.0)
Hemoglobin: 14.5 g/dL (ref 12.0–15.0)
Immature Granulocytes: 0 %
Lymphocytes Relative: 31 %
Lymphs Abs: 1.2 10*3/uL (ref 0.7–4.0)
MCH: 30.7 pg (ref 26.0–34.0)
MCHC: 33.3 g/dL (ref 30.0–36.0)
MCV: 92.4 fL (ref 80.0–100.0)
Monocytes Absolute: 0.3 10*3/uL (ref 0.1–1.0)
Monocytes Relative: 7 %
Neutro Abs: 2.3 10*3/uL (ref 1.7–7.7)
Neutrophils Relative %: 60 %
Platelets: 349 10*3/uL (ref 150–400)
RBC: 4.72 MIL/uL (ref 3.87–5.11)
RDW: 12.9 % (ref 11.5–15.5)
WBC: 3.9 10*3/uL — ABNORMAL LOW (ref 4.0–10.5)
nRBC: 0 % (ref 0.0–0.2)

## 2020-11-22 LAB — COMPREHENSIVE METABOLIC PANEL
ALT: 26 U/L (ref 0–44)
AST: 31 U/L (ref 15–41)
Albumin: 4 g/dL (ref 3.5–5.0)
Alkaline Phosphatase: 68 U/L (ref 38–126)
Anion gap: 10 (ref 5–15)
BUN: 13 mg/dL (ref 6–20)
CO2: 28 mmol/L (ref 22–32)
Calcium: 9.8 mg/dL (ref 8.9–10.3)
Chloride: 99 mmol/L (ref 98–111)
Creatinine, Ser: 1.07 mg/dL — ABNORMAL HIGH (ref 0.44–1.00)
GFR, Estimated: >60 mL/min (ref >60)
Glucose, Bld: 109 mg/dL — ABNORMAL HIGH (ref 70–99)
Potassium: 3.6 mmol/L (ref 3.5–5.1)
Sodium: 137 mmol/L (ref 135–145)
Total Bilirubin: 1.3 mg/dL — ABNORMAL HIGH (ref 0.3–1.2)
Total Protein: 7.8 g/dL (ref 6.5–8.1)

## 2020-11-22 LAB — I-STAT BETA HCG BLOOD, ED (MC, WL, AP ONLY): I-stat hCG, quantitative: <5 m[IU]/mL (ref <5)

## 2020-11-22 LAB — LIPASE, BLOOD: Lipase: 27 U/L (ref 11–51)

## 2020-11-22 LAB — MAGNESIUM: Magnesium: 2.1 mg/dL (ref 1.7–2.4)

## 2020-11-22 MED ORDER — ONDANSETRON HCL 4 MG/2ML IJ SOLN
4.0000 mg | Freq: Once | INTRAMUSCULAR | Status: AC
  Administered 2020-11-22: 4 mg via INTRAVENOUS
  Filled 2020-11-22: qty 2

## 2020-11-22 MED ORDER — SODIUM CHLORIDE 0.9 % IV BOLUS
1000.0000 mL | Freq: Once | INTRAVENOUS | Status: AC
  Administered 2020-11-22: 1000 mL via INTRAVENOUS

## 2020-11-22 NOTE — Discharge Instructions (Signed)
You are seen in the emergency department for nausea and vomiting.  You had lab work done that did not show any significant abnormalities.  Your symptoms improved with some IV fluids and nausea medication.  This is likely due to the medications that you were started on recently.  Please follow-up with your primary care doctor.  Return to the emergency department for any worsening or concerning symptoms.
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
## 2020-11-22 NOTE — ED Triage Notes (Signed)
Pt here via POV with reports of emesis since last night. Pt reports being unable to keep down new medications prescribed yesterday.

## 2020-11-23 ENCOUNTER — Emergency Department (HOSPITAL_BASED_OUTPATIENT_CLINIC_OR_DEPARTMENT_OTHER)
Admission: EM | Admit: 2020-11-23 | Discharge: 2020-11-23 | Disposition: A | Discharge status: Home / Self Care | Payer: Medicaid Other | Attending: Emergency Medicine | Admitting: Emergency Medicine

## 2020-11-23 ENCOUNTER — Encounter (HOSPITAL_BASED_OUTPATIENT_CLINIC_OR_DEPARTMENT_OTHER): Payer: Self-pay | Admitting: Obstetrics and Gynecology

## 2020-11-23 ENCOUNTER — Other Ambulatory Visit: Payer: Self-pay

## 2020-11-23 ENCOUNTER — Ambulatory Visit (HOSPITAL_COMMUNITY)
Admission: EM | Admit: 2020-11-23 | Discharge: 2020-11-23 | Disposition: A | Discharge status: Home / Self Care | Payer: No Typology Code available for payment source | Source: Ambulatory Visit | Attending: Emergency Medicine | Admitting: Emergency Medicine

## 2020-11-23 DIAGNOSIS — J45909 Unspecified asthma, uncomplicated: Secondary | ICD-10-CM | POA: Insufficient documentation

## 2020-11-23 DIAGNOSIS — Z0441 Encounter for examination and observation following alleged adult rape: Secondary | ICD-10-CM | POA: Insufficient documentation

## 2020-11-23 DIAGNOSIS — R1110 Vomiting, unspecified: Secondary | ICD-10-CM | POA: Insufficient documentation

## 2020-11-23 DIAGNOSIS — R197 Diarrhea, unspecified: Secondary | ICD-10-CM | POA: Insufficient documentation

## 2020-11-23 DIAGNOSIS — R1013 Epigastric pain: Secondary | ICD-10-CM | POA: Insufficient documentation

## 2020-11-23 DIAGNOSIS — T7421XA Adult sexual abuse, confirmed, initial encounter: Secondary | ICD-10-CM

## 2020-11-23 DIAGNOSIS — R112 Nausea with vomiting, unspecified: Secondary | ICD-10-CM

## 2020-11-23 LAB — COMPREHENSIVE METABOLIC PANEL
ALT: 19 U/L (ref 0–44)
AST: 19 U/L (ref 15–41)
Albumin: 4 g/dL (ref 3.5–5.0)
Alkaline Phosphatase: 55 U/L (ref 38–126)
Anion gap: 8 (ref 5–15)
BUN: 14 mg/dL (ref 6–20)
CO2: 29 mmol/L (ref 22–32)
Calcium: 9.2 mg/dL (ref 8.9–10.3)
Chloride: 101 mmol/L (ref 98–111)
Creatinine, Ser: 0.82 mg/dL (ref 0.44–1.00)
GFR, Estimated: >60 mL/min (ref >60)
Glucose, Bld: 94 mg/dL (ref 70–99)
Potassium: 3.8 mmol/L (ref 3.5–5.1)
Sodium: 138 mmol/L (ref 135–145)
Total Bilirubin: 0.6 mg/dL (ref 0.3–1.2)
Total Protein: 7.2 g/dL (ref 6.5–8.1)

## 2020-11-23 LAB — CBC
HCT: 39.1 % (ref 36.0–46.0)
Hemoglobin: 12.8 g/dL (ref 12.0–15.0)
MCH: 30.5 pg (ref 26.0–34.0)
MCHC: 32.7 g/dL (ref 30.0–36.0)
MCV: 93.1 fL (ref 80.0–100.0)
Platelets: 320 10*3/uL (ref 150–400)
RBC: 4.2 MIL/uL (ref 3.87–5.11)
RDW: 12.8 % (ref 11.5–15.5)
WBC: 5.4 10*3/uL (ref 4.0–10.5)
nRBC: 0 % (ref 0.0–0.2)

## 2020-11-23 LAB — LIPASE, BLOOD: Lipase: 15 U/L (ref 11–51)

## 2020-11-23 MED ORDER — SODIUM CHLORIDE 0.9 % IV BOLUS
1000.0000 mL | Freq: Once | INTRAVENOUS | Status: AC
  Administered 2020-11-23: 1000 mL via INTRAVENOUS

## 2020-11-23 MED ORDER — ONDANSETRON HCL 4 MG/2ML IJ SOLN
4.0000 mg | Freq: Once | INTRAMUSCULAR | Status: AC
  Administered 2020-11-23: 4 mg via INTRAVENOUS
  Filled 2020-11-23: qty 2

## 2020-11-23 MED ORDER — ULIPRISTAL ACETATE 30 MG PO TABS
30.0000 mg | ORAL_TABLET | Freq: Once | ORAL | Status: AC
  Administered 2020-11-23: 30 mg via ORAL
  Filled 2020-11-23: qty 1

## 2020-11-23 MED ORDER — ONDANSETRON HCL 4 MG PO TABS: 4.0000 mg | ORAL_TABLET | Freq: Three times a day (TID) | ORAL | 0 refills | Status: DC | PRN

## 2020-11-23 NOTE — ED Triage Notes (Signed)
Patient reports to the ER for nausea and emesis. Patient reports she was recently placed on new medications and wants to see if she needs to be on something different.
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
## 2020-11-23 NOTE — Discharge Instructions (Addendum)
Call your primary care doctor or specialist as discussed in the next 2-3 days.   Return immediately back to the ER if:  Your symptoms worsen within the next 12-24 hours. You develop new symptoms such as new fevers, persistent vomiting, new pain, shortness of breath, or new weakness or numbness, or if you have any other concerns.  
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
## 2020-11-23 NOTE — ED Provider Notes (Signed)
Patient seen by the SANE nurse.  Otherwise will be discharged home from the ER now given prescription of Zofran.  Advised return if she has pain fevers bleeding or any additional concerns.   Cheryll Cockayne, MD 11/23/20 [phone]
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
## 2020-11-23 NOTE — ED Notes (Signed)
SANE nurse at bedside still
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
## 2020-11-23 NOTE — ED Notes (Signed)
Patient was d/c by SANE nurse.

## 2020-12-07 ENCOUNTER — Other Ambulatory Visit (HOSPITAL_COMMUNITY): Payer: Self-pay

## 2020-12-09 ENCOUNTER — Ambulatory Visit: Payer: Medicaid Other

## 2020-12-13 ENCOUNTER — Ambulatory Visit: Payer: Medicaid Other

## 2020-12-27 ENCOUNTER — Ambulatory Visit: Payer: Medicaid Other

## 2020-12-27 ENCOUNTER — Other Ambulatory Visit: Payer: Self-pay

## 2020-12-27 ENCOUNTER — Ambulatory Visit: Payer: Medicaid Other | Admitting: Family Medicine

## 2020-12-27 ENCOUNTER — Encounter: Payer: Self-pay | Admitting: Family Medicine

## 2020-12-27 DIAGNOSIS — Z113 Encounter for screening for infections with a predominantly sexual mode of transmission: Secondary | ICD-10-CM

## 2020-12-27 LAB — WET PREP FOR TRICH, YEAST, CLUE
Trichomonas Exam: NEGATIVE
Yeast Exam: NEGATIVE
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
## 2020-12-27 NOTE — Progress Notes (Signed)
Wet mount results reviewed with Provider.  No treatment needed.  Condoms given to pt. Berdie Ogren, RN

## 2021-01-01 LAB — GONOCOCCUS CULTURE

## 2021-08-20 ENCOUNTER — Emergency Department (HOSPITAL_BASED_OUTPATIENT_CLINIC_OR_DEPARTMENT_OTHER): Payer: Medicaid Other | Admitting: Radiology

## 2021-08-20 ENCOUNTER — Emergency Department (HOSPITAL_BASED_OUTPATIENT_CLINIC_OR_DEPARTMENT_OTHER)
Admission: EM | Admit: 2021-08-20 | Discharge: 2021-08-20 | Disposition: A | Discharge status: Home / Self Care | Payer: Medicaid Other | Attending: Emergency Medicine | Admitting: Emergency Medicine

## 2021-08-20 ENCOUNTER — Encounter (HOSPITAL_BASED_OUTPATIENT_CLINIC_OR_DEPARTMENT_OTHER): Payer: Self-pay

## 2021-08-20 DIAGNOSIS — S2232XA Fracture of one rib, left side, initial encounter for closed fracture: Secondary | ICD-10-CM | POA: Diagnosis not present

## 2021-08-20 DIAGNOSIS — W109XXA Fall (on) (from) unspecified stairs and steps, initial encounter: Secondary | ICD-10-CM | POA: Diagnosis not present

## 2021-08-20 DIAGNOSIS — S199XXA Unspecified injury of neck, initial encounter: Secondary | ICD-10-CM | POA: Diagnosis present

## 2021-08-20 DIAGNOSIS — W19XXXA Unspecified fall, initial encounter: Secondary | ICD-10-CM

## 2021-08-20 MED ORDER — HYDROCODONE-ACETAMINOPHEN 5-325 MG PO TABS
1.0000 | ORAL_TABLET | Freq: Once | ORAL | Status: AC
  Administered 2021-08-20: 1 via ORAL
  Filled 2021-08-20 (×2): qty 1

## 2021-08-20 MED ORDER — IBUPROFEN 800 MG PO TABS: 800.0000 mg | ORAL_TABLET | Freq: Three times a day (TID) | ORAL | 0 refills | Status: DC

## 2021-08-20 MED ORDER — HYDROCODONE-ACETAMINOPHEN 5-325 MG PO TABS: 1.0000 | ORAL_TABLET | Freq: Four times a day (QID) | ORAL | 0 refills | Status: AC | PRN
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
## 2021-08-20 NOTE — Discharge Instructions (Signed)
1.  Take ibuprofen 800 mg every 8 hours for pain control.  You may take 1-2 Vicodin every 6 hours if needed for additional pain control. 2.  Follow-up with your family doctor for recheck within the next 3 to 7 days. 3.  Return to the emergency department if you see any blood in your urine, get a fever, worsening shortness of breath, worsening pain or other concerning symptoms.

## 2021-08-20 NOTE — ED Provider Notes (Signed)
[facility] EMERGENCY DEPT Provider Note   CSN: [id number] Arrival date & time: 08/20/21  3220     History  Chief Complaint  Patient presents with   Rib Injury    Heather Lin is a 41 y.o. female.  HPI Patient reports she was going down the outside stairs yesterday and she slipped.  She tried to grab for the handrail but ended up falling such that she struck her left rib cage on the handrail instead of catching it.  She reports that broke her fall so when she went to the ground she did not injure anything else.  She denies any head injury.  She denies any pain in the extremities.  No weakness numbness tingling to extremities.  She reports since that time however she has a severe sharp pain on her left side.  She reports it hurts a lot if she tries to take a deep breath or moves a certain way.  She has been alternating ibuprofen and Tylenol with some pain relief but she reports it is still severe when she needs to do certain things like lifting or getting a deep breath.  He denies any blood in the urine.  She has not felt lightheaded or dizzy like she would pass out.    Home Medications Prior to Admission medications   Medication Sig Start Date End Date Taking? Authorizing Provider  cyclobenzaprine (FLEXERIL) 10 MG tablet TAKE 1 TABLET(10 MG) BY MOUTH THREE TIMES DAILY AS NEEDED FOR MUSCLE SPASMS Patient not taking: No sig reported 04/01/19   Doristine Bosworth, MD  elvitegravir-cobicistat-emtricitabine-tenofovir (GENVOYA) 150-150-200-10 MG TABS tablet Take 1 tablet by mouth daily with breakfast. Patient not taking: Reported on 12/27/2020 11/20/20   Gailen Shelter, PA  escitalopram (LEXAPRO) 20 MG tablet Take 20 mg by mouth daily. Patient not taking: Reported on 12/27/2020    [provider]  FEROSUL 325 (65 Fe) MG tablet Take 1 tablet by mouth daily. Patient not taking: No sig reported 08/14/17   [provider]  ibuprofen (ADVIL,MOTRIN) 600 MG tablet  TAKE 1 TABLET(600 MG) BY MOUTH EVERY 6 HOURS with food Patient not taking: No sig reported 01/14/18   McVey, Madelaine Bhat, PA-C  Multiple Vitamins-Minerals (WOMENS 50+ MULTI VITAMIN/MIN PO) Take by mouth.    [provider]  norethindrone (ORTHO MICRONOR) 0.35 MG tablet Take 1 tablet (0.35 mg total) by mouth daily. Patient not taking: No sig reported 09/29/17   Nigel Bridgeman, CNM  ondansetron (ZOFRAN) 4 MG tablet Take 1 tablet (4 mg total) by mouth every 8 (eight) hours as needed for nausea or vomiting. Patient not taking: Reported on 12/27/2020 11/23/20   Cheryll Cockayne, MD  Prenatal Vit-Fe Fumarate-FA (PRENATAL MULTIVITAMIN) TABS tablet Take 1 tablet by mouth daily at 12 noon. Patient not taking: No sig reported    [provider]      Allergies    Dilaudid [hydromorphone hcl]    Review of Systems   Review of Systems 10 systems reviewed and negative except per HPI Physical Exam Updated Vital Signs BP 118/70 (BP Location: Right Arm)    Pulse 74    Temp 98.6 F (37 C) (Oral)    Resp 16    LMP 07/04/2021 (Approximate)    SpO2 99%  Physical Exam Constitutional:      Comments: Alert nontoxic.  Well-nourished well-developed.  Clinically well in appearance.  Pain with certain movements but otherwise well.  HENT:     Head: Normocephalic and atraumatic.  Mouth/Throat:     Pharynx: Oropharynx is clear.  Eyes:     Extraocular Movements: Extraocular movements intact.  Cardiovascular:     Rate and Rhythm: Normal rate and regular rhythm.  Pulmonary:     Effort: Pulmonary effort is normal.     Breath sounds: Normal breath sounds.     Comments: Patient has significant reproducible chest wall pain at about the lower third of the chest wall on the left from about the anterior axillary line to the mid axillary line.  No crepitus or palpable abnormality. Chest:     Chest wall: Tenderness present.  Abdominal:     General: There is no distension.     Palpations: Abdomen is  soft.     Tenderness: There is no abdominal tenderness. There is no guarding.  Musculoskeletal:        General: No swelling or tenderness. Normal range of motion.     Cervical back: Neck supple.     Right lower leg: No edema.     Left lower leg: No edema.  Skin:    General: Skin is warm and dry.  Neurological:     General: No focal deficit present.     Mental Status: She is oriented to person, place, and time.     Motor: No weakness.     Coordination: Coordination normal.  Psychiatric:        Mood and Affect: Mood normal.    ED Results / Procedures / Treatments   Labs (all labs ordered are listed, but only abnormal results are displayed) Labs Reviewed - No data to display  EKG None  Radiology No results found.  Procedures Procedures    Medications Ordered in ED Medications - No data to display  ED Course/ Medical Decision Making/ A&P                           Medical Decision Making  Patient presents with a mechanical fall landing on her left rib cage.  Clinical findings are consistent with a rib fracture.  Patient does not have other findings of lightheadedness or abdominal pain to suggest solid organ injury.  Vital signs are stable.  Injury occurred approximately 24 hours ago.  Chest x-ray confirms rib fracture.  At this time with patient otherwise stable and no signs of pneumothorax or likely other injury based on clinical exam, will treat for pain with ibuprofen 800 mg 3 times a day and hydrocodone 1 to 2 tablets as needed for additional pain control.  Plan reviewed with the patient.  We reviewed following up with her PCP for recheck.  We reviewed strict return precautions and pain management. Final Clinical Impression(s) / ED Diagnoses Final diagnoses:  None    Rx / DC Orders ED Discharge Orders     None         Arby Barrette, MD 08/20/21 1205

## 2021-11-02 ENCOUNTER — Emergency Department (HOSPITAL_BASED_OUTPATIENT_CLINIC_OR_DEPARTMENT_OTHER)
Admission: EM | Admit: 2021-11-02 | Discharge: 2021-11-02 | Disposition: A | Discharge status: Home / Self Care | Payer: Medicaid Other | Attending: Emergency Medicine | Admitting: Emergency Medicine

## 2021-11-02 ENCOUNTER — Encounter (HOSPITAL_BASED_OUTPATIENT_CLINIC_OR_DEPARTMENT_OTHER): Payer: Self-pay

## 2021-11-02 ENCOUNTER — Other Ambulatory Visit: Payer: Self-pay

## 2021-11-02 ENCOUNTER — Emergency Department (HOSPITAL_BASED_OUTPATIENT_CLINIC_OR_DEPARTMENT_OTHER): Payer: Medicaid Other | Admitting: Radiology

## 2021-11-02 DIAGNOSIS — S56911A Strain of unspecified muscles, fascia and tendons at forearm level, right arm, initial encounter: Secondary | ICD-10-CM | POA: Diagnosis not present

## 2021-11-02 DIAGNOSIS — F32A Depression, unspecified: Secondary | ICD-10-CM | POA: Insufficient documentation

## 2021-11-02 DIAGNOSIS — W228XXA Striking against or struck by other objects, initial encounter: Secondary | ICD-10-CM | POA: Insufficient documentation

## 2021-11-02 DIAGNOSIS — S59901A Unspecified injury of right elbow, initial encounter: Secondary | ICD-10-CM | POA: Diagnosis present

## 2021-11-02 DIAGNOSIS — M25521 Pain in right elbow: Secondary | ICD-10-CM | POA: Insufficient documentation

## 2021-11-02 MED ORDER — OXYCODONE-ACETAMINOPHEN 5-325 MG PO TABS
1.0000 | ORAL_TABLET | Freq: Once | ORAL | Status: AC
  Administered 2021-11-02: 1 via ORAL
  Filled 2021-11-02: qty 1

## 2021-11-02 NOTE — Discharge Instructions (Addendum)
For your elbow pain, recommend using the sling as needed for support.  Bear weight as tolerated.  Take Tylenol, Motrin as needed for pain. ? ?Regarding your depression, I recommend following up with your psychiatrist and counselor.  If you cannot get a close follow-up appointment with them, you can also seek care at the behavioral health urgent care or seek care at a facility on the paperwork provided.  If you develop any thoughts of suicide or hurting yourself, please seek help immediately in the emergency department or at the [facility]). Marland Kitchen  ?

## 2021-11-02 NOTE — ED Provider Notes (Signed)
?[facility] EMERGENCY DEPT ?Provider Note ? ? ?CSN: [id number] ?Arrival date & time: 11/02/21  1711 ? ?  ? ?History ? ?Chief Complaint  ?Patient presents with  ? Elbow Pain  ? ? ?Heather Lin is a 41 y.o. female.  Presents to ER with concern for elbow pain.  Patient reports that this morning she hit her arm and has been having elbow pain ever since.  Sharp, stabbing, worse with movement improved with rest.  No numbness, weakness, denies any other injuries or pain elsewhere.  She states that she has been feeling depressed lately.  She does not have any thoughts of suicide or thoughts of self-harm.  She states that she does have a psychiatrist and counselor, last saw psychiatrist about a month ago. ? ?Accompanied by husband. ?HPI ? ?  ? ?Home Medications ?Prior to Admission medications   ?Medication Sig Start Date End Date Taking? Authorizing Provider  ?cyclobenzaprine (FLEXERIL) 10 MG tablet TAKE 1 TABLET(10 MG) BY MOUTH THREE TIMES DAILY AS NEEDED FOR MUSCLE SPASMS ?Patient not taking: No sig reported 04/01/19   Doristine Bosworth, MD  ?elvitegravir-cobicistat-emtricitabine-tenofovir (GENVOYA) 150-150-200-10 MG TABS tablet Take 1 tablet by mouth daily with breakfast. ?Patient not taking: Reported on 12/27/2020 11/20/20   Gailen Shelter, PA  ?escitalopram (LEXAPRO) 20 MG tablet Take 20 mg by mouth daily. ?Patient not taking: Reported on 12/27/2020    [provider]  ?FEROSUL 325 (65 Fe) MG tablet Take 1 tablet by mouth daily. ?Patient not taking: No sig reported 08/14/17   [provider]  ?HYDROcodone-acetaminophen (NORCO/VICODIN) 5-325 MG tablet Take 1-2 tablets by mouth every 6 (six) hours as needed for moderate pain or severe pain. 08/20/21   Arby Barrette, MD  ?ibuprofen (ADVIL) 800 MG tablet Take 1 tablet (800 mg total) by mouth 3 (three) times daily. 08/20/21   Arby Barrette, MD  ?ibuprofen (ADVIL,MOTRIN) 600 MG tablet TAKE 1 TABLET(600 MG) BY MOUTH EVERY 6 HOURS with  food ?Patient not taking: No sig reported 01/14/18   McVey, Madelaine Bhat, PA-C  ?Multiple Vitamins-Minerals (WOMENS 50+ MULTI VITAMIN/MIN PO) Take by mouth.    [provider]  ?norethindrone (ORTHO MICRONOR) 0.35 MG tablet Take 1 tablet (0.35 mg total) by mouth daily. ?Patient not taking: No sig reported 09/29/17   Nigel Bridgeman, CNM  ?ondansetron (ZOFRAN) 4 MG tablet Take 1 tablet (4 mg total) by mouth every 8 (eight) hours as needed for nausea or vomiting. ?Patient not taking: Reported on 12/27/2020 11/23/20   Cheryll Cockayne, MD  ?Prenatal Vit-Fe Fumarate-FA (PRENATAL MULTIVITAMIN) TABS tablet Take 1 tablet by mouth daily at 12 noon. ?Patient not taking: No sig reported    [provider]  ?   ? ?Allergies    ?Dilaudid [hydromorphone hcl]   ? ?Review of Systems   ?Review of Systems  ?Musculoskeletal:  Positive for arthralgias.  ?All other systems reviewed and are negative. ? ?Physical Exam ?Updated Vital Signs ?BP 113/63 (BP Location: Left Arm)   Pulse 69   Temp 98.5 ?F (36.9 ?C)   Resp 18   LMP 10/29/2021 (Approximate)   SpO2 100%  ?Physical Exam ?Vitals and nursing note reviewed.  ?Constitutional:   ?   General: She is not in acute distress. ?   Appearance: She is well-developed.  ?HENT:  ?   Head: Normocephalic and atraumatic.  ?Eyes:  ?   Conjunctiva/sclera: Conjunctivae normal.  ?Cardiovascular:  ?   Rate and Rhythm: Normal rate and regular rhythm.  ?  Heart sounds: No murmur heard. ?Pulmonary:  ?   Effort: Pulmonary effort is normal. No respiratory distress.  ?Musculoskeletal:  ?   Cervical back: Neck supple.  ?   Comments: Right upper extremity: There is some mild swelling to the elbow, some tenderness to the elbow but normal ROM, normal radial pulse, distal sensation and cap refill and motor intact  ?Skin: ?   General: Skin is warm and dry.  ?   Capillary Refill: Capillary refill takes less than 2 seconds.  ?Neurological:  ?   Mental Status: She is alert.  ?Psychiatric:     ?    Mood and Affect: Mood normal.  ? ? ?ED Results / Procedures / Treatments   ?Labs ?(all labs ordered are listed, but only abnormal results are displayed) ?Labs Reviewed - No data to display ? ?EKG ?None ? ?Radiology ?DG Elbow Complete Right ? ?Result Date: 11/02/2021 ?CLINICAL DATA:  Injury EXAM: RIGHT ELBOW - COMPLETE 3+ VIEW COMPARISON:  None. FINDINGS: There is no evidence of fracture, dislocation, or joint effusion. There is no evidence of arthropathy or other focal bone abnormality. There is posterior elbow soft tissue swelling. IMPRESSION: 1. No acute bony abnormality. 2. Posterior soft tissue swelling. Electronically Signed   By: Darliss Cheney M.D.   On: 11/02/2021 18:03   ? ?Procedures ?Procedures  ? ? ?Medications Ordered in ED ?Medications  ?oxyCODONE-acetaminophen (PERCOCET/ROXICET) 5-325 MG per tablet 1 tablet (1 tablet Oral Given 11/02/21 1925)  ? ? ?ED Course/ Medical Decision Making/ A&P ?  ?                        ?Medical Decision Making ?Amount and/or Complexity of Data Reviewed ?Radiology: ordered. ? ?Risk ?Prescription drug management. ? ? ?41 year old lady presenting to ER with concern for right elbow pain.  On exam noted mild swelling but has normal ROM, neurovascular intact.  X-ray independently reviewed by myself.  No acute findings noted.  Provided some pain medicine with Percocet, symptoms improved.  Will provide sling for support, suspect MSK.  Recommended follow-up with Ortho or sports medicine if symptoms not improving over the next few days.  Patient did endorse having some depression, she denies any SI or thoughts of self-harm.  Feels safe at home.  Husband at bedside agrees.  Feel she is appropriate for outpatient follow-up from a psychiatric standpoint at present.  Advised to follow-up with her regular psychiatrist and counselor, provided additional local behavioral health/psychiatry resources.  Reviewed return precautions and discharged. ? ? ? ?After the discussed management above, the  patient was determined to be safe for discharge.  The patient was in agreement with this plan and all questions regarding their care were answered.  ED return precautions were discussed and the patient will return to the ED with any significant worsening of condition. ? ? ? ? ? ? ? ? ?Final Clinical Impression(s) / ED Diagnoses ?Final diagnoses:  ?Strain of right elbow, initial encounter  ? ? ?Rx / DC Orders ?ED Discharge Orders   ? ? None  ? ?  ? ? ?  ?Milagros Loll, MD ?11/02/21 1937 ? ?

## 2021-11-02 NOTE — ED Triage Notes (Signed)
Pt presents with Right elbow pain, states she was moving around this morning hit her arm and has been having pain ever since, states "it feels like something shattered in there"  ?

## 2022-01-16 ENCOUNTER — Ambulatory Visit: Payer: Medicaid Other | Admitting: Family

## 2022-01-24 ENCOUNTER — Emergency Department (HOSPITAL_BASED_OUTPATIENT_CLINIC_OR_DEPARTMENT_OTHER): Payer: BC Managed Care – PPO | Admitting: Radiology

## 2022-01-24 ENCOUNTER — Emergency Department (HOSPITAL_BASED_OUTPATIENT_CLINIC_OR_DEPARTMENT_OTHER)
Admission: EM | Admit: 2022-01-24 | Discharge: 2022-01-24 | Disposition: A | Discharge status: Home / Self Care | Payer: BC Managed Care – PPO | Attending: Emergency Medicine | Admitting: Emergency Medicine

## 2022-01-24 ENCOUNTER — Other Ambulatory Visit: Payer: Self-pay

## 2022-01-24 ENCOUNTER — Emergency Department (HOSPITAL_BASED_OUTPATIENT_CLINIC_OR_DEPARTMENT_OTHER): Payer: BC Managed Care – PPO

## 2022-01-24 ENCOUNTER — Other Ambulatory Visit (HOSPITAL_BASED_OUTPATIENT_CLINIC_OR_DEPARTMENT_OTHER): Payer: Self-pay

## 2022-01-24 DIAGNOSIS — S7011XA Contusion of right thigh, initial encounter: Secondary | ICD-10-CM | POA: Insufficient documentation

## 2022-01-24 DIAGNOSIS — S60221A Contusion of right hand, initial encounter: Secondary | ICD-10-CM

## 2022-01-24 DIAGNOSIS — M79644 Pain in right finger(s): Secondary | ICD-10-CM | POA: Diagnosis not present

## 2022-01-24 DIAGNOSIS — S199XXA Unspecified injury of neck, initial encounter: Secondary | ICD-10-CM | POA: Insufficient documentation

## 2022-01-24 DIAGNOSIS — S161XXA Strain of muscle, fascia and tendon at neck level, initial encounter: Secondary | ICD-10-CM

## 2022-01-24 DIAGNOSIS — S40012A Contusion of left shoulder, initial encounter: Secondary | ICD-10-CM | POA: Diagnosis not present

## 2022-01-24 MED ORDER — OXYCODONE-ACETAMINOPHEN 5-325 MG PO TABS
1.0000 | ORAL_TABLET | Freq: Once | ORAL | Status: AC
  Administered 2022-01-24: 1 via ORAL
  Filled 2022-01-24: qty 1

## 2022-01-24 MED ORDER — IOHEXOL 350 MG/ML SOLN
100.0000 mL | Freq: Once | INTRAVENOUS | Status: AC | PRN
  Administered 2022-01-24: 75 mL via INTRAVENOUS

## 2022-01-24 MED ORDER — OXYCODONE-ACETAMINOPHEN 5-325 MG PO TABS
1.0000 | ORAL_TABLET | Freq: Four times a day (QID) | ORAL | 0 refills | Status: AC | PRN
  Filled 2022-01-24: qty 15, 2d supply, fill #0

## 2022-01-24 MED ORDER — METHOCARBAMOL 500 MG PO TABS
500.0000 mg | ORAL_TABLET | Freq: Two times a day (BID) | ORAL | 0 refills | Status: DC
  Filled 2022-01-24: qty 20, 10d supply, fill #0

## 2022-01-24 MED ORDER — IBUPROFEN 400 MG PO TABS
400.0000 mg | ORAL_TABLET | Freq: Once | ORAL | Status: AC
  Administered 2022-01-24: 400 mg via ORAL
  Filled 2022-01-24: qty 1

## 2022-01-24 NOTE — ED Notes (Signed)
Discharge instructions, prescriptions, and follow up care reviewed and explained, pt verbalized understanding and had no further questions at d/c. Pt states she feels safe leaving because her husband is picking her up and he is safe. Pt was moved to vehicle by wheelchair and left in care of her husband.
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
## 2022-02-05 ENCOUNTER — Ambulatory Visit: Payer: Medicaid Other | Admitting: Family

## 2022-02-05 ENCOUNTER — Other Ambulatory Visit (HOSPITAL_BASED_OUTPATIENT_CLINIC_OR_DEPARTMENT_OTHER): Payer: Self-pay

## 2022-03-01 ENCOUNTER — Ambulatory Visit: Payer: Medicaid Other | Admitting: Family

## 2022-03-13 ENCOUNTER — Ambulatory Visit: Payer: Medicaid Other | Admitting: Nurse Practitioner

## 2022-04-01 ENCOUNTER — Other Ambulatory Visit: Payer: Self-pay

## 2022-04-01 ENCOUNTER — Encounter (HOSPITAL_BASED_OUTPATIENT_CLINIC_OR_DEPARTMENT_OTHER): Payer: Self-pay

## 2022-04-01 ENCOUNTER — Emergency Department (HOSPITAL_BASED_OUTPATIENT_CLINIC_OR_DEPARTMENT_OTHER)
Admission: EM | Admit: 2022-04-01 | Discharge: 2022-04-01 | Disposition: A | Discharge status: Home / Self Care | Payer: BC Managed Care – PPO | Attending: Emergency Medicine | Admitting: Emergency Medicine

## 2022-04-01 DIAGNOSIS — M5442 Lumbago with sciatica, left side: Secondary | ICD-10-CM | POA: Diagnosis not present

## 2022-04-01 DIAGNOSIS — M545 Low back pain, unspecified: Secondary | ICD-10-CM | POA: Diagnosis present

## 2022-04-01 MED ORDER — PREDNISONE 20 MG PO TABS: 40.0000 mg | ORAL_TABLET | Freq: Every day | ORAL | 0 refills | Status: AC

## 2022-04-01 MED ORDER — METHOCARBAMOL 500 MG PO TABS: 500.0000 mg | ORAL_TABLET | Freq: Two times a day (BID) | ORAL | 0 refills | Status: AC

## 2022-04-01 NOTE — ED Provider Notes (Signed)
MEDCENTER [facility] EMERGENCY DEPT Provider Note   CSN: [id number] Arrival date & time: 04/01/22  1758     History  Chief Complaint  Patient presents with   Back Pain    Heather Lin is a 41 y.o. female with past medical history significant for previous lumbar radiculopathy, previous sciatica who presents with concern for low back pain which radiates down left leg for the last 1 week.  Patient denies any new injuries.  She has been taking ibuprofen and Tylenol around-the-clock without significant relief.  She reports she does not typically like muscle relaxants or steroids because of weight gain, and drowsiness with muscle relaxants.  She has seen orthopedics and a physical therapist in the past which she reports helped significantly.  She reports that she sits at her job for around 8 hours of time which she thinks is exacerbating her pain.  She denies previous History of IV drug use, chronic corticosteroid use, history of cancer, recent fever, pain waking her up from sleep.  She denies any saddle anesthesia, urinary fecal incontinence, numbness, tingling.   Back Pain      Home Medications Prior to Admission medications   Medication Sig Start Date End Date Taking? Authorizing Provider  methocarbamol (ROBAXIN) 500 MG tablet Take 1 tablet (500 mg total) by mouth 2 (two) times daily. 04/01/22  Yes Mariateresa Batra H, PA-C  predniSONE (DELTASONE) 20 MG tablet Take 2 tablets (40 mg total) by mouth daily. 04/01/22  Yes Mickey Esguerra H, PA-C  elvitegravir-cobicistat-emtricitabine-tenofovir (GENVOYA) 150-150-200-10 MG TABS tablet Take 1 tablet by mouth daily with breakfast. Patient not taking: Reported on 12/27/2020 11/20/20   Gailen Shelter, PA  escitalopram (LEXAPRO) 20 MG tablet Take 20 mg by mouth daily. Patient not taking: Reported on 12/27/2020    [provider]  FEROSUL 325 (65 Fe) MG tablet Take 1 tablet by mouth daily. Patient not taking: No sig reported  08/14/17   [provider]  HYDROcodone-acetaminophen (NORCO/VICODIN) 5-325 MG tablet Take 1-2 tablets by mouth every 6 (six) hours as needed for moderate pain or severe pain. 08/20/21   Arby Barrette, MD  ibuprofen (ADVIL) 800 MG tablet Take 1 tablet (800 mg total) by mouth 3 (three) times daily. 08/20/21   Arby Barrette, MD  ibuprofen (ADVIL,MOTRIN) 600 MG tablet TAKE 1 TABLET(600 MG) BY MOUTH EVERY 6 HOURS with food Patient not taking: No sig reported 01/14/18   McVey, Madelaine Bhat, PA-C  Multiple Vitamins-Minerals (WOMENS 50+ MULTI VITAMIN/MIN PO) Take by mouth.    [provider]  norethindrone (ORTHO MICRONOR) 0.35 MG tablet Take 1 tablet (0.35 mg total) by mouth daily. Patient not taking: No sig reported 09/29/17   Nigel Bridgeman, CNM  ondansetron (ZOFRAN) 4 MG tablet Take 1 tablet (4 mg total) by mouth every 8 (eight) hours as needed for nausea or vomiting. Patient not taking: Reported on 12/27/2020 11/23/20   Cheryll Cockayne, MD  oxyCODONE-acetaminophen (PERCOCET/ROXICET) 5-325 MG tablet Take 1-2 tablets by mouth every 6 (six) hours as needed for severe pain. 01/24/22   Lorre Nick, MD  Prenatal Vit-Fe Fumarate-FA (PRENATAL MULTIVITAMIN) TABS tablet Take 1 tablet by mouth daily at 12 noon. Patient not taking: No sig reported    [provider]      Allergies    Dilaudid [hydromorphone hcl]    Review of Systems   Review of Systems  Musculoskeletal:  Positive for back pain.  All other systems reviewed and are negative.   Physical Exam Updated Vital  Signs BP 120/78 (BP Location: Right Arm)   Pulse 75   Temp (!) 97.1 F (36.2 C) (Temporal)   Resp 16   Ht 5\' 3"  (1.6 m)   Wt 56.7 kg   SpO2 100%   BMI 22.14 kg/m  Physical Exam Vitals and nursing note reviewed.  Constitutional:      General: She is not in acute distress.    Appearance: Normal appearance.  HENT:     Head: Normocephalic and atraumatic.  Eyes:     General:        Right eye: No  discharge.        Left eye: No discharge.  Cardiovascular:     Rate and Rhythm: Normal rate and regular rhythm.     Pulses: Normal pulses.  Pulmonary:     Effort: Pulmonary effort is normal. No respiratory distress.  Musculoskeletal:        General: No deformity.     Comments: Patient with tenderness palpation paraspinous muscles of the left and right lumbar spine, worse on the left.  She has positive straight leg raise on the left.  She has intact strength 5 out of 5 of bilateral lower extremities.  Intact gait without difficulty.  No step-off or deformity of the midline spine, no evidence of traumatic injury.  Skin:    General: Skin is warm and dry.     Capillary Refill: Capillary refill takes less than 2 seconds.  Neurological:     Mental Status: She is alert and oriented to person, place, and time.  Psychiatric:        Mood and Affect: Mood normal.        Behavior: Behavior normal.     ED Results / Procedures / Treatments   Labs (all labs ordered are listed, but only abnormal results are displayed) Labs Reviewed - No data to display  EKG None  Radiology No results found.  Procedures Procedures    Medications Ordered in ED Medications - No data to display  ED Course/ Medical Decision Making/ A&P                           Medical Decision Making  Patient with back pain.  My emergent differential diagnosis includes slipped disc, compression fracture, spondylolisthesis, less clinical concern for epidural abscess or osteomyelitis based on patient history.  No neurological deficits. Patient is ambulatory. No warning symptoms of back pain including: fecal incontinence, urinary retention or overflow incontinence, night sweats, waking from sleep with back pain, unexplained fevers or weight loss, h/o cancer, IVDU, recent trauma. No concern for cauda equina, epidural abscess, or other serious cause of back pain.  Given this work-up, evaluation, physical exam I do not believe that  radiographic imaging is indicated at this time.  Conservative measures such as rest, ice/heat, ibuprofen, Tylenol, and  prescription for Robaxin indicated with orthopedic follow-up if no improvement with conservative management.  Given ongoing history of back pain, known sciatica think that a short course of steroids would be reasonable in this patient.  She understands and agrees to this plan.  No previous history of hypertension, diabetes. extensive return precautions given, patient discharged in stable condition at this time.   Final Clinical Impression(s) / ED Diagnoses Final diagnoses:  Acute left-sided low back pain with left-sided sciatica    Rx / DC Orders ED Discharge Orders          Ordered    methocarbamol (ROBAXIN) 500  MG tablet  2 times daily        04/01/22 1959    predniSONE (DELTASONE) 20 MG tablet  Daily        04/01/22 1959              [place] 04/01/22 2002    Margarita Grizzle, MD 04/03/22 1226

## 2022-04-01 NOTE — Discharge Instructions (Signed)
Please use Tylenol or ibuprofen for pain.  You may use 600 mg ibuprofen every 6 hours or 1000 mg of Tylenol every 6 hours.  You may choose to alternate between the 2.  This would be most effective.  Not to exceed 4 g of Tylenol within 24 hours.  Not to exceed 3200 mg ibuprofen 24 hours.  You can use the muscle relaxant I am prescribing in addition to the above to help with any breakthrough pain.  You can take it up to twice daily.  It is safe to take at night, but I would be cautious taking it during the day as it can cause some drowsiness.  Make sure that you are feeling awake and alert before you get behind the wheel of a car or operate a motor vehicle.  It is not a narcotic pain medication so you are able to take it if it is not making you drowsy and still pilot a vehicle or machinery safely.  I recommend follow-up with orthopedics as we discussed.  Please try some of the rehab exercises I provided in the meantime.

## 2022-04-01 NOTE — ED Triage Notes (Signed)
Patient here POV from Home.  Endorses Back Pain that began approximately 7 Days ago and has worsened since. Pain is Lumbar Back and radiates upwards and down Left Leg.   History of Sciatica. No Fevers. No Dysuria.   NAD Noted during Triage. A&Ox4. GCS 15. Ambulatory.

## 2022-04-03 ENCOUNTER — Ambulatory Visit: Payer: BC Managed Care – PPO | Admitting: Family Medicine

## 2022-04-09 ENCOUNTER — Ambulatory Visit: Payer: BC Managed Care – PPO | Admitting: Family Medicine

## 2022-04-17 ENCOUNTER — Ambulatory Visit: Payer: Medicaid Other | Admitting: Family

## 2022-06-09 ENCOUNTER — Emergency Department (HOSPITAL_BASED_OUTPATIENT_CLINIC_OR_DEPARTMENT_OTHER)
Admission: EM | Admit: 2022-06-09 | Discharge: 2022-06-09 | Disposition: A | Discharge status: Home / Self Care | Payer: BC Managed Care – PPO | Attending: Emergency Medicine | Admitting: Emergency Medicine

## 2022-06-09 ENCOUNTER — Other Ambulatory Visit: Payer: Self-pay

## 2022-06-09 ENCOUNTER — Encounter (HOSPITAL_BASED_OUTPATIENT_CLINIC_OR_DEPARTMENT_OTHER): Payer: Self-pay | Admitting: Emergency Medicine

## 2022-06-09 DIAGNOSIS — R221 Localized swelling, mass and lump, neck: Secondary | ICD-10-CM | POA: Insufficient documentation

## 2022-06-09 LAB — CBC WITH DIFFERENTIAL/PLATELET
Abs Immature Granulocytes: 0.01 10*3/uL (ref 0.00–0.07)
Basophils Absolute: 0.1 10*3/uL (ref 0.0–0.1)
Basophils Relative: 1 %
Eosinophils Absolute: 0.2 10*3/uL (ref 0.0–0.5)
Eosinophils Relative: 2 %
HCT: 36.3 % (ref 36.0–46.0)
Hemoglobin: 12.1 g/dL (ref 12.0–15.0)
Immature Granulocytes: 0 %
Lymphocytes Relative: 52 %
Lymphs Abs: 3.9 10*3/uL (ref 0.7–4.0)
MCH: 29.8 pg (ref 26.0–34.0)
MCHC: 33.3 g/dL (ref 30.0–36.0)
MCV: 89.4 fL (ref 80.0–100.0)
Monocytes Absolute: 0.5 10*3/uL (ref 0.1–1.0)
Monocytes Relative: 6 %
Neutro Abs: 3 10*3/uL (ref 1.7–7.7)
Neutrophils Relative %: 39 %
Platelets: 317 10*3/uL (ref 150–400)
RBC: 4.06 MIL/uL (ref 3.87–5.11)
RDW: 13.2 % (ref 11.5–15.5)
WBC: 7.6 10*3/uL (ref 4.0–10.5)
nRBC: 0 % (ref 0.0–0.2)

## 2022-06-09 LAB — BASIC METABOLIC PANEL
Anion gap: 6 (ref 5–15)
BUN: 14 mg/dL (ref 6–20)
CO2: 28 mmol/L (ref 22–32)
Calcium: 9.4 mg/dL (ref 8.9–10.3)
Chloride: 104 mmol/L (ref 98–111)
Creatinine, Ser: 0.75 mg/dL (ref 0.44–1.00)
GFR, Estimated: >60 mL/min (ref >60)
Glucose, Bld: 95 mg/dL (ref 70–99)
Potassium: 3.9 mmol/L (ref 3.5–5.1)
Sodium: 138 mmol/L (ref 135–145)

## 2022-06-09 NOTE — Discharge Instructions (Signed)
Please follow up with a primary care provider.  Your lab work here was unremarkable.

## 2022-06-09 NOTE — ED Provider Notes (Signed)
  [place] EMERGENCY DEPT Provider Note   CSN: [id number] Arrival date & time: 06/09/22  1513     History {Add pertinent medical, surgical, social history, OB history to HPI:1} Chief Complaint  Patient presents with  . Abscess    Heather Lin is a 41 y.o. female.   Abscess      Home Medications Prior to Admission medications   Medication Sig Start Date End Date Taking? Authorizing Provider  HYDROcodone-acetaminophen (NORCO/VICODIN) 5-325 MG tablet Take 1-2 tablets by mouth every 6 (six) hours as needed for moderate pain or severe pain. 08/20/21   Charlesetta Shanks, MD  methocarbamol (ROBAXIN) 500 MG tablet Take 1 tablet (500 mg total) by mouth 2 (two) times daily. 04/01/22   Prosperi, Darrick Meigs H, PA-C  Multiple Vitamins-Minerals (WOMENS 50+ MULTI VITAMIN/MIN PO) Take by mouth.    [provider]  oxyCODONE-acetaminophen (PERCOCET/ROXICET) 5-325 MG tablet Take 1-2 tablets by mouth every 6 (six) hours as needed for severe pain. 01/24/22   Lacretia Leigh, MD  predniSONE (DELTASONE) 20 MG tablet Take 2 tablets (40 mg total) by mouth daily. 04/01/22   Prosperi, Christian H, PA-C      Allergies    Dilaudid [hydromorphone hcl]    Review of Systems   Review of Systems  Physical Exam Updated Vital Signs BP 107/64   Pulse 79   Temp 98.3 F (36.8 C)   Resp 14   SpO2 100%  Physical Exam  ED Results / Procedures / Treatments   Labs (all labs ordered are listed, but only abnormal results are displayed) Labs Reviewed  CBC WITH DIFFERENTIAL/PLATELET  BASIC METABOLIC PANEL    EKG None  Radiology No results found.  Procedures Procedures  {Document cardiac monitor, telemetry assessment procedure when appropriate:1}  Medications Ordered in ED Medications - No data to display  ED Course/ Medical Decision Making/ A&P                           Medical Decision Making Amount and/or Complexity of Data Reviewed Labs:  ordered.   ***  {Document critical care time when appropriate:1} {Document review of labs and clinical decision tools ie heart score, Chads2Vasc2 etc:1}  {Document your independent review of radiology images, and any outside records:1} {Document your discussion with family members, caretakers, and with consultants:1} {Document social determinants of health affecting pt's care:1} {Document your decision making why or why not admission, treatments were needed:1} Final Clinical Impression(s) / ED Diagnoses Final diagnoses:  None    Rx / DC Orders ED Discharge Orders     None

## 2022-06-09 NOTE — ED Triage Notes (Signed)
Knot/swelling under chin,noticed about a week ago.

## 2022-06-19 ENCOUNTER — Other Ambulatory Visit: Payer: Self-pay | Admitting: Internal Medicine

## 2022-06-19 DIAGNOSIS — R221 Localized swelling, mass and lump, neck: Secondary | ICD-10-CM

## 2022-06-25 ENCOUNTER — Other Ambulatory Visit: Payer: Self-pay | Admitting: Internal Medicine

## 2022-06-25 DIAGNOSIS — R221 Localized swelling, mass and lump, neck: Secondary | ICD-10-CM

## 2022-06-26 ENCOUNTER — Ambulatory Visit
Admission: RE | Admit: 2022-06-26 | Discharge: 2022-06-26 | Disposition: A | Discharge status: Home / Self Care | Payer: BC Managed Care – PPO | Source: Ambulatory Visit | Attending: Internal Medicine | Admitting: Internal Medicine

## 2022-06-26 DIAGNOSIS — R221 Localized swelling, mass and lump, neck: Secondary | ICD-10-CM

## 2022-07-10 ENCOUNTER — Ambulatory Visit: Payer: Medicaid Other

## 2022-08-03 ENCOUNTER — Other Ambulatory Visit (HOSPITAL_COMMUNITY): Payer: Self-pay | Admitting: Internal Medicine

## 2022-08-03 DIAGNOSIS — R221 Localized swelling, mass and lump, neck: Secondary | ICD-10-CM

## 2022-08-03 NOTE — Progress Notes (Signed)
Heather Overlie, MD  Leodis Rains D Dr. Jena Gauss wants an US guided biopsy of neck nodule.  Supposedly there will be an order.  Please schedule ASAP.  Thanks, Heather Lin

## 2022-08-10 ENCOUNTER — Other Ambulatory Visit (HOSPITAL_COMMUNITY): Payer: Self-pay | Admitting: Internal Medicine

## 2022-08-10 ENCOUNTER — Ambulatory Visit (HOSPITAL_COMMUNITY)
Admission: RE | Admit: 2022-08-10 | Discharge: 2022-08-10 | Disposition: A | Discharge status: Home / Self Care | Payer: BC Managed Care – PPO | Source: Ambulatory Visit | Attending: Internal Medicine | Admitting: Internal Medicine

## 2022-08-10 DIAGNOSIS — R221 Localized swelling, mass and lump, neck: Secondary | ICD-10-CM | POA: Diagnosis present

## 2022-08-10 MED ORDER — LORAZEPAM 1 MG PO TABS
2.0000 mg | ORAL_TABLET | Freq: Once | ORAL | Status: AC
  Administered 2022-08-10: 2 mg via ORAL
  Filled 2022-08-10: qty 2

## 2022-08-10 MED ORDER — LORAZEPAM 2 MG PO TABS
2.0000 mg | ORAL_TABLET | Freq: Once | ORAL | Status: DC
  Filled 2022-08-10: qty 1

## 2022-08-10 MED ORDER — LIDOCAINE HCL (PF) 1 % IJ SOLN
INTRAMUSCULAR | Status: AC
  Filled 2022-08-10: qty 30
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
## 2022-12-03 ENCOUNTER — Encounter: Payer: Self-pay | Admitting: *Deleted

## 2022-12-12 ENCOUNTER — Other Ambulatory Visit: Payer: Self-pay | Admitting: Nurse Practitioner

## 2022-12-12 DIAGNOSIS — Z1231 Encounter for screening mammogram for malignant neoplasm of breast: Secondary | ICD-10-CM

## 2023-01-29 ENCOUNTER — Other Ambulatory Visit: Payer: Self-pay | Admitting: Internal Medicine

## 2023-01-29 DIAGNOSIS — Z1231 Encounter for screening mammogram for malignant neoplasm of breast: Secondary | ICD-10-CM

## 2023-02-19 ENCOUNTER — Ambulatory Visit: Payer: BC Managed Care – PPO

## 2023-02-26 ENCOUNTER — Ambulatory Visit: Payer: BC Managed Care – PPO

## 2023-03-19 ENCOUNTER — Ambulatory Visit
Admission: RE | Admit: 2023-03-19 | Discharge: 2023-03-19 | Disposition: A | Discharge status: Home / Self Care | Payer: BC Managed Care – PPO | Source: Ambulatory Visit | Attending: Internal Medicine | Admitting: Internal Medicine

## 2023-03-19 DIAGNOSIS — Z1231 Encounter for screening mammogram for malignant neoplasm of breast: Secondary | ICD-10-CM

## 2023-05-21 NOTE — Progress Notes (Signed)
Patient attended screening event 04/17/2023. Her Blood pressure was within normal limits. Patient declined SDOH screening no needs at this time. Per primary care is [facility] - not able to recall name of physician.Marland Kitchen

## 2023-07-03 NOTE — Progress Notes (Signed)
The patient attended 05/18/23 screening event where her screening results were wnl. At the event the patient noted she has insurance and receives primary care at [facility]. Patient declined SDOH Questionnaire. Per chart review pt does not have a primary care doctor. Chart review did not indicate any future appts.   CG called pt and was able to schedule an appt for primary care on 07/26/23 at 3:00pm with Dinah C Ngetich. CG emailed pt appointment  A sixty day follow up will be done to ensure pt made it to pcp appt.

## 2023-07-26 ENCOUNTER — Encounter: Payer: BC Managed Care – PPO | Admitting: Family

## 2023-07-28 NOTE — Progress Notes (Signed)
  This encounter was created in error - please disregard. No show 

## 2023-08-22 ENCOUNTER — Ambulatory Visit (HOSPITAL_COMMUNITY)
Admission: EM | Admit: 2023-08-22 | Discharge: 2023-08-22 | Disposition: A | Discharge status: Home / Self Care | Payer: BC Managed Care – PPO

## 2023-08-22 DIAGNOSIS — F339 Major depressive disorder, recurrent, unspecified: Secondary | ICD-10-CM | POA: Diagnosis not present

## 2023-08-22 DIAGNOSIS — R4589 Other symptoms and signs involving emotional state: Secondary | ICD-10-CM | POA: Diagnosis not present

## 2023-08-22 DIAGNOSIS — Z79899 Other long term (current) drug therapy: Secondary | ICD-10-CM | POA: Diagnosis not present
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
## 2023-08-22 NOTE — ED Provider Notes (Signed)
 Behavioral Health Urgent Care Medical Screening Exam  Patient Name: Heather Lin MRN: [id number] Date of Evaluation: 08/23/23 Chief Complaint:   Diagnosis:  Final diagnoses:  Encounter for medication management  Anxious appearance  Recurrent major depressive disorder, remission status unspecified (HCC)    History of Present illness: Heather Lin is a 43 y.o. female. With a history of MDD,  and anxiety presented to [facility], per the patient she has been feeling down a bit and her psychiatrist is on vacation this week.  According to the patient she had an appointment at 1 PM today but then the office called and told her to come to [facility] so they can adjust her medicine here.  When asked what does she mean by adjusting her medicine patient stated that they prescribed her the lowest dose of Klonopin and is not working.  Patient stated that she took Xanax  before and the Klonopin is not working.  Writer discussed with patient that she need to reach out to her psychiatrist who started her on the Klonopin and have him prescribe it because we will not be able to do that.   Face-to-face observation of patient, patient is alert and oriented x 4, speech is clear, patient is observed with a very dark sunglasses on her face when you could not see her eyes.  Patient fixated that she has been taking the lowest dose of Klonopin and it does not work.  Patient denies SI, HI, AVH or paranoia.  Denies alcohol use, denies illicit drug use. Pt does appear to be medication seeking.  Writer discus with patient to f/u with her outpatient provider to increase her klonopin because we wont be able to change her medication at this time.  Discuss with pt to call 911 or go to her nearest ED should she encounter any SI/AVH or deterioration in her mental status.  Pt verbalize understanding.  Recommend discharge for patient to f/u with outpatient provider   Flowsheet Row ED from 08/22/2023 in [facility] ED from 06/09/2022 in [facility] Emergency Department at [facility] ED from 04/01/2022 in [facility] Emergency Department at [facility]  C-SSRS RISK CATEGORY No Risk No Risk No Risk       Psychiatric Specialty Exam  Presentation  General Appearance:Casual  Eye Contact:Good  Speech:Clear and Coherent  Speech Volume:Normal  Handedness:Right   Mood and Affect  Mood: Anxious  Affect: Appropriate   Thought Process  Thought Processes: Coherent  Descriptions of Associations:Intact  Orientation:Full (Time, Place and Person)  Thought Content:Logical    Hallucinations:None  Ideas of Reference:None  Suicidal Thoughts:No  Homicidal Thoughts:No   Sensorium  Memory: Immediate Good  Judgment: Fair  Insight: Fair   Executive Functions  Concentration: Good  Attention Span: Good  Recall: Good  Fund of Knowledge: Good  Language: Good   Psychomotor Activity  Psychomotor Activity: Normal   Assets  Assets: Communication Skills   Sleep  Sleep: Fair  Number of hours:  8   Physical Exam: Physical Exam HENT:     Head: Normocephalic.     Nose: Nose normal.  Eyes:     Pupils: Pupils are equal, round, and reactive to light.  Cardiovascular:     Rate and Rhythm: Normal rate.  Pulmonary:     Effort: Pulmonary effort is normal.  Musculoskeletal:        General: Normal range of motion.     Cervical back: Normal range of motion.  Neurological:  General: No focal deficit present.     Mental Status: She is alert.  Psychiatric:        Mood and Affect: Mood normal.        Behavior: Behavior normal.        Thought Content: Thought content normal.        Judgment: Judgment normal.    Review of Systems  Constitutional: Negative.   HENT: Negative.    Eyes: Negative.   Respiratory: Negative.    Cardiovascular: Negative.   Gastrointestinal: Negative.   Genitourinary: Negative.   Musculoskeletal:  Negative.   Skin: Negative.   Neurological: Negative.   Psychiatric/Behavioral:  The patient is nervous/anxious.    Blood pressure 121/84, pulse 77, temperature 98.4 F (36.9 C), temperature source Oral, resp. rate 17, SpO2 100%. There is no height or weight on file to calculate BMI.  Musculoskeletal: Strength & Muscle Tone: within normal limits Gait & Station: normal Patient leans: N/A   [facility] MSE Discharge Disposition for Follow up and Recommendations: Based on my evaluation the patient does not appear to have an emergency medical condition and can be discharged with resources and follow up care in outpatient services for Medication Management   Gaither Pouch, NP 08/23/2023, 3:29 AM

## 2023-09-02 ENCOUNTER — Encounter: Payer: Self-pay | Admitting: *Deleted

## 2023-09-02 NOTE — Progress Notes (Signed)
 Pt attended 05/18/23 screening event where her b/p was 118/68. At the event, the pt listed [facility] as her PCP (listed as an Occupational health service) but did not list PCP name. Pt also noted have [employer] , did not note a smoking status,and pt did not document any SDOH insecurities. Chart review does not reveal any [facility]-visible [facility] encounters, although pt has accessed [facility] care, most recently on 08/22/23. Health equity team member unable to contact pt by phone (VM left) so [facility] contacted to verify PCP status. [facility] states their records do not indicate that this pt is being seen by any of their occupational NP. Letter sent to pt with [facility] and [facility] info and housing resources in case pt had still not established with a PCP or continues to have a housing insecurity. Additional pt f/u to be scheduled per health equity protocol

## 2023-12-24 ENCOUNTER — Encounter: Payer: Self-pay | Admitting: *Deleted

## 2023-12-24 NOTE — Progress Notes (Signed)
 Pt attended 05/18/23 screening event where her b/p was 118/68. At the event, the pt listed [facility] as her PCP (listed as an Occupational health service) but did not list PCP name. Pt also noted have [employer] , did not note a smoking status,and pt did not document any SDOH insecurities. Chart review at the 6 month event f/u does not reveal any [facility]-visible [facility] encounters, or any other PCP or health care encounters other than pt has accessed [facility] care on 08/22/23. Health equity team member unable to contact pt by phone and at 6 months, unable to leave VM, although VM left at previous 60 day event f/u. During 60 day f/u,[facility] contacted to verify PCP status and state their records do not indicate that this pt is being seen by any of their occupational NP. During initial f/u attempt, health equity team member made PCP appt for pt with Christean Courts, at [facility] but pt no-showed that 07/26/23 appt. Final letter sent to pt with [facility] and [facility] clinic info  in case pt had still not established with a PCP. No additional health equity team support scheduled at this time.
